# Patient Record
Sex: Female | Born: 1970 | Race: Black or African American | Hispanic: No | Marital: Single | Smoking: Former smoker
Health system: Southern US, Community
[De-identification: ages and names within clinical notes are randomized; demographics above are authoritative.]

## PROBLEM LIST (undated history)

## (undated) ENCOUNTER — Emergency Department: Discharge status: Absent without leave | Payer: Self-pay

## (undated) DIAGNOSIS — J45909 Unspecified asthma, uncomplicated: Secondary | ICD-10-CM

## (undated) DIAGNOSIS — M1990 Unspecified osteoarthritis, unspecified site: Secondary | ICD-10-CM

## (undated) DIAGNOSIS — D649 Anemia, unspecified: Secondary | ICD-10-CM

## (undated) HISTORY — PX: KNEE ARTHROSCOPY: SUR90

## (undated) HISTORY — DX: Anemia, unspecified: D64.9

---

## 2003-12-24 ENCOUNTER — Ambulatory Visit (HOSPITAL_COMMUNITY): Admission: RE | Admit: 2003-12-24 | Discharge: 2003-12-24 | Payer: Self-pay | Admitting: Gynecology

## 2005-07-20 ENCOUNTER — Emergency Department: Payer: Self-pay | Admitting: Emergency Medicine

## 2006-08-12 ENCOUNTER — Observation Stay: Payer: Self-pay | Admitting: Internal Medicine

## 2011-02-12 ENCOUNTER — Observation Stay: Payer: Self-pay | Admitting: Obstetrics and Gynecology

## 2012-01-29 ENCOUNTER — Ambulatory Visit: Payer: Self-pay

## 2014-08-15 ENCOUNTER — Emergency Department
Admission: EM | Admit: 2014-08-15 | Discharge: 2014-08-15 | Disposition: A | Discharge status: Home / Self Care | Payer: Self-pay | Attending: Emergency Medicine | Admitting: Emergency Medicine

## 2014-08-15 DIAGNOSIS — K029 Dental caries, unspecified: Secondary | ICD-10-CM | POA: Insufficient documentation

## 2014-08-15 DIAGNOSIS — K047 Periapical abscess without sinus: Secondary | ICD-10-CM | POA: Insufficient documentation

## 2014-08-15 MED ORDER — HYDROCODONE-ACETAMINOPHEN 5-325 MG PO TABS: 1.0000 | ORAL_TABLET | ORAL | Status: DC | PRN

## 2014-08-15 MED ORDER — CLINDAMYCIN HCL 300 MG PO CAPS: 300.0000 mg | ORAL_CAPSULE | Freq: Three times a day (TID) | ORAL | Status: DC

## 2014-08-15 MED ORDER — IBUPROFEN 800 MG PO TABS: 800.0000 mg | ORAL_TABLET | Freq: Three times a day (TID) | ORAL | Status: DC | PRN

## 2014-08-15 NOTE — ED Provider Notes (Signed)
[facility] Emergency Department Provider Note  ____________________________________________  Time seen: Approximately 7:20 AM  I have reviewed the triage vital signs and the nursing notes.   HISTORY  Chief Complaint Dental Pain    HPI Lizeth Bencosme is a 44 y.o. female presents with 11 day history of dental pain. Just recently finished a course of antibiotics of amoxicillin. Still complains of dental swelling noted around the left lower jaw. Denies any trauma states hurts to eat. Rates pain as an 8/10 unable to sleep last night. Has a dental appointment this week.  No past medical history on file.  There are no active problems to display for this patient.   No past surgical history on file.  Current Outpatient Rx  Name  Route  Sig  Dispense  Refill  . clindamycin (CLEOCIN) 300 MG capsule   Oral   Take 1 capsule (300 mg total) by mouth 3 (three) times daily.   30 capsule   0   . HYDROcodone-acetaminophen (NORCO) 5-325 MG per tablet   Oral   Take 1 tablet by mouth every 4 (four) hours as needed for moderate pain.   12 tablet   0   . ibuprofen (ADVIL,MOTRIN) 800 MG tablet   Oral   Take 1 tablet (800 mg total) by mouth every 8 (eight) hours as needed.   30 tablet   0     Allergies Review of patient's allergies indicates no known allergies.  No family history on file.  Social History History  Substance Use Topics  . Smoking status: Not on file  . Smokeless tobacco: Not on file  . Alcohol Use: Not on file    Review of Systems Constitutional: No fever/chills elevated blood pressure noted. Eyes: No visual changes. ENT: No sore throat. Cardiovascular: Denies chest pain. Respiratory: Denies shortness of breath. Gastrointestinal: No abdominal pain.  No nausea, no vomiting.  No diarrhea.  No constipation. Genitourinary: Negative for dysuria. Musculoskeletal: Negative for back pain. Skin: Negative for rash. Neurological: Negative  for headaches, focal weakness or numbness. \ 10-point ROS otherwise negative.  ____________________________________________   PHYSICAL EXAM:  VITAL SIGNS: ED Triage Vitals  Enc Vitals Group     BP 08/15/14 0525 150/100 mmHg     Pulse Rate 08/15/14 0525 81     Resp 08/15/14 0525 20     Temp 08/15/14 0525 98 F (36.7 C)     Temp Source 08/15/14 0525 Oral     SpO2 08/15/14 0525 100 %     Weight 08/15/14 0526 180 lb (81.647 kg)     Height 08/15/14 0526 5\' 3"  (1.6 m)     Head Cir --      Peak Flow --      Pain Score 08/15/14 0526 8     Pain Loc --      Pain Edu? --      Excl. in [place]? --    \ Constitutional: Alert and oriented. Well appearing and in no acute distress. Head: Atraumatic. Nose: No congestion/rhinnorhea. Mouth/Throat: Mucous membranes are moist.  Oropharynx non-erythematous. Obvious dental caries noted left lower side. Tenderness to the jaw Neck: No stridor.   Cardiovascular: Normal rate, regular rhythm. Grossly normal heart sounds.  Good peripheral circulation. Respiratory: Normal respiratory effort.  No retractions. Lungs CTAB. Neurologic:  Normal speech and language. No gross focal neurologic deficits are appreciated. Speech is normal. No gait instability. Skin:  Skin is warm, dry and intact. No rash noted. Psychiatric: Mood and affect  are normal. Speech and behavior are normal.  ____________________________________________   LABS (all labs ordered are listed, but only abnormal results are displayed)  Labs Reviewed - No data to display ____________________________________________  EKG  None ____________________________________________  RADIOLOGY  None ____________________________________________   PROCEDURES  Procedure(s) performed: None  Critical Care performed: No  ____________________________________________   INITIAL IMPRESSION / ASSESSMENT AND PLAN / ED COURSE  Pertinent labs & imaging results that were available during my care of  the patient were reviewed by me and considered in my medical decision making (see chart for details).  Dental abscess. We'll treat prophylactically with clindamycin 300 mg 4 times a day, ibuprofen 800 mg 3 times a day, and hydrocodone. Patient has appointment this week with dentist follow-up. However an up-dated list was provided. No other EMC at this visit. Patient understands to return to the ER if worsening symptomatology. In addition, patient was instructed to monitor her blood pressure and follow up when her pain is decreased. ____________________________________________   FINAL CLINICAL IMPRESSION(S) / ED DIAGNOSES  Final diagnoses:  Dental abscess      Arlyss Repress, PA-C 08/15/14 1950  Lavonia Drafts, MD 08/15/14 [phone]

## 2014-08-15 NOTE — ED Notes (Signed)
Pt presents to ER stating right lower tooth pain. Pt states she was seen at [facility] several days ago and given narcotics and antibiotics. Pt states worsened pain tonight.

## 2014-08-15 NOTE — Discharge Instructions (Signed)
Dental Abscess A dental abscess is a collection of infected fluid (pus) from a bacterial infection in the inner part of the tooth (pulp). It usually occurs at the end of the tooth's root.  CAUSES   Severe tooth decay.  Trauma to the tooth that allows bacteria to enter into the pulp, such as a broken or chipped tooth. SYMPTOMS   Severe pain in and around the infected tooth.  Swelling and redness around the abscessed tooth or in the mouth or face.  Tenderness.  Pus drainage.  Bad breath.  Bitter taste in the mouth.  Difficulty swallowing.  Difficulty opening the mouth.  Nausea.  Vomiting.  Chills.  Swollen neck glands. DIAGNOSIS   A medical and dental history will be taken.  An examination will be performed by tapping on the abscessed tooth.  X-rays may be taken of the tooth to identify the abscess. TREATMENT The goal of treatment is to eliminate the infection. You may be prescribed antibiotic medicine to stop the infection from spreading. A root canal may be performed to save the tooth. If the tooth cannot be saved, it may be pulled (extracted) and the abscess may be drained.  HOME CARE INSTRUCTIONS  Only take over-the-counter or prescription medicines for pain, fever, or discomfort as directed by your caregiver.  Rinse your mouth (gargle) often with salt water ( tsp salt in 8 oz [250 ml] of warm water) to relieve pain or swelling.  Do not drive after taking pain medicine (narcotics).  Do not apply heat to the outside of your face.  Return to your dentist for further treatment as directed. SEEK MEDICAL CARE IF:  Your pain is not helped by medicine.  Your pain is getting worse instead of better. SEEK IMMEDIATE MEDICAL CARE IF:  You have a fever or persistent symptoms for more than 2-3 days.  You have a fever and your symptoms suddenly get worse.  You have chills or a very bad headache.  You have problems breathing or swallowing.  You have trouble  opening your mouth.  You have swelling in the neck or around the eye. Document Released: 03/12/2005 Document Revised: 12/05/2011 Document Reviewed: 06/20/2010 [facility] Patient Information 2015 [place], [place]. This information is not intended to replace advice given to you by your health care provider. Make sure you discuss any questions you have with your health care provider.   OPTIONS FOR DENTAL FOLLOW UP CARE  [place] [facility] and [place] [email or web address] [phone])  [facility] [phone])  [place] [phone])  [place] [phone])  [facility] [phone]) This clinic caters to the indigent population and is on a lottery system. Location: [employer] [facility], [place], [place], [place] Clinic Hours: Wednesdays from 6pm - 9pm, patients seen by a lottery system. For dates, call or go to [email or web address] Services: Cleanings, fillings and simple extractions. Payment Options: DENTAL WORK IS FREE OF CHARGE. Bring proof of income or support. Best way to get seen: Arrive at 5:15 pm - this is a lottery, NOT first come/first serve, so arriving earlier will not increase your chances of being seen.     [facility] [phone] Select option 1 for emergencies   Location: [facility], [place], [address], [place] Clinic Hours: No walk-ins accepted - call the day before to schedule an appointment. Check in times  are 9:30 am and 1:30 pm. Services: Simple extractions, temporary fillings, pulpectomy/pulp debridement, uncomplicated abscess drainage. Payment Options: PAYMENT IS DUE AT THE TIME OF SERVICE.  Fee is usually $100-200, additional surgical procedures (e.g. abscess drainage) may  be extra. Cash, checks, Visa/MasterCard accepted.  Can file Medicaid if patient is covered for dental - patient should call case worker to check. No discount for [facility] patients. Best way to get seen: MUST call the day before and get onto the schedule. Can usually be seen the next 1-2 days. No walk-ins accepted.     [place] [phone]   Location: [place], [place] Clinic Hours: M, W, Th, F 8am or 1:30pm, Tues 9a or 1:30 - first come/first served. Services: Simple extractions, temporary fillings, uncomplicated abscess drainage.  You do not need to be an [facility] resident. Payment Options: PAYMENT IS DUE AT THE TIME OF SERVICE. Dental insurance, otherwise sliding scale - bring proof of income or support. Depending on income and treatment needed, cost is usually $50-200. Best way to get seen: Arrive early as it is first come/first served.     [facility] [phone]   Location: [place] [facility] Hours: Mon-Thu 8a-5p Services: Most basic dental services including extractions and fillings. Payment Options: PAYMENT IS DUE AT THE TIME OF SERVICE. Sliding scale, up to 50% off - bring proof if income or support. Medicaid with dental option accepted. Best way to get seen: Call to schedule an appointment, can usually be seen within 2 weeks OR they will try to see walk-ins - show up at [place] or 2p (you may have to wait).     [facility] [place] RESIDENTS ONLY   Location: [facility], [place] [address] [place], [place] [address] Clinic Hours: By appointment only. Monday - Thursday 8am-5pm, Friday 8am-12pm Services: Cleanings, fillings, extractions. Payment Options: PAYMENT IS DUE AT THE TIME OF SERVICE. Cash, Visa or MasterCard. Sliding scale - $30 minimum per service. Best way to get seen: Come in to  office, complete packet and make an appointment - need proof of income or support monies for each household member and proof of [facility] residence. Usually takes about a month to get in.     [facility] [phone]   Location: [address] [place] Clinic Hours: Walk-in Urgent Care Dental Services are offered Monday-Friday mornings only. The numbers of emergencies accepted daily is limited to the number of providers available. Maximum 15 - Mondays, Wednesdays & Thursdays Maximum 10 - Tuesdays & Fridays Services: You do not need to be a [facility] resident to be seen for a dental emergency. Emergencies are defined as pain, swelling, abnormal bleeding, or dental trauma. Walkins will receive x-rays if needed. NOTE: Dental cleaning is not an emergency. Payment Options: PAYMENT IS DUE AT THE TIME OF SERVICE. Minimum co-pay is $40.00 for uninsured patients. Minimum co-pay is $3.00 for Medicaid with dental coverage. Dental Insurance is accepted and must be presented at time of visit. Medicare does not cover dental. Forms of payment: Cash, credit card, checks. Best way to get seen: If not previously registered with the clinic, walk-in dental registration begins at 7:15 am and is on a first come/first serve basis. If previously registered with the clinic, call to make an appointment.     [facility] [place] ONLY   Location: [address]  [place], [place] Clinic Hours: Mon-Thu 10a-2p Services: Extractions only! Payment Options: FREE (donations accepted) - bring proof of income or support Best way to get seen: Call and schedule an appointment OR come at 8am on the 1st Monday of every month (except for holidays) when it is first come/first served.     [facility] [phone]   Location: [place], [place] Clinic Hours: Friday mornings Services, Payment Options, Best way to get  seen: Call for info

## 2014-08-15 NOTE — ED Notes (Signed)
Pt states she was seen at [facility] for toothache. She was prescribed amoxicillin, last night facial swelling started, pt states the swelling in her face is getting bigger and bigger. Pain 8/10. Swelling noted to R side of face.

## 2014-08-15 NOTE — ED Notes (Signed)
NAD noted at time of D/C. Pt denies questions or concerns. Pt ambulatory to the lobby at this time.  

## 2015-10-13 ENCOUNTER — Emergency Department: Payer: Self-pay

## 2015-10-13 ENCOUNTER — Emergency Department
Admission: EM | Admit: 2015-10-13 | Discharge: 2015-10-13 | Disposition: A | Discharge status: Home / Self Care | Payer: Self-pay | Attending: Emergency Medicine | Admitting: Emergency Medicine

## 2015-10-13 ENCOUNTER — Encounter: Payer: Self-pay | Admitting: Emergency Medicine

## 2015-10-13 DIAGNOSIS — J45909 Unspecified asthma, uncomplicated: Secondary | ICD-10-CM | POA: Insufficient documentation

## 2015-10-13 DIAGNOSIS — M1990 Unspecified osteoarthritis, unspecified site: Secondary | ICD-10-CM | POA: Insufficient documentation

## 2015-10-13 DIAGNOSIS — J209 Acute bronchitis, unspecified: Secondary | ICD-10-CM

## 2015-10-13 DIAGNOSIS — Z87891 Personal history of nicotine dependence: Secondary | ICD-10-CM | POA: Insufficient documentation

## 2015-10-13 HISTORY — DX: Unspecified osteoarthritis, unspecified site: M19.90

## 2015-10-13 MED ORDER — ALBUTEROL SULFATE HFA 108 (90 BASE) MCG/ACT IN AERS: 2.0000 | INHALATION_SPRAY | Freq: Four times a day (QID) | RESPIRATORY_TRACT | Status: DC | PRN

## 2015-10-13 MED ORDER — AZITHROMYCIN 250 MG PO TABS: ORAL_TABLET | ORAL | Status: DC

## 2015-10-13 MED ORDER — IPRATROPIUM-ALBUTEROL 0.5-2.5 (3) MG/3ML IN SOLN
3.0000 mL | Freq: Once | RESPIRATORY_TRACT | Status: AC
  Administered 2015-10-13: 3 mL via RESPIRATORY_TRACT
  Filled 2015-10-13: qty 3

## 2015-10-13 MED ORDER — GUAIFENESIN-CODEINE 100-10 MG/5ML PO SOLN: 10.0000 mL | Freq: Three times a day (TID) | ORAL | Status: DC | PRN

## 2015-10-13 MED ORDER — PREDNISONE 10 MG PO TABS: 50.0000 mg | ORAL_TABLET | Freq: Every day | ORAL | Status: DC

## 2015-10-13 NOTE — Discharge Instructions (Signed)

## 2015-10-13 NOTE — ED Notes (Addendum)
Chest congestion and cough for about 1 month  States min relief with OTC meds  And cough is occasionally prod

## 2015-10-13 NOTE — ED Provider Notes (Signed)
[facility] Emergency Department Provider Note  ____________________________________________  Time seen: Approximately 8:14 AM  I have reviewed the triage vital signs and the nursing notes.   HISTORY  Chief Complaint No chief complaint on file.   HPI Tiffany Rodriguez is a 45 y.o. female who presents to the emergency department for evaluation of wheezing, cough, and chest congestion for approximately one month. She's had no relief with Mucinex. She is an asthmatic who is been out of her inhaler for quite some time. She states that she now has pain in her right mid and upper back with deep breath and cough. She denies fever. She does report some mild shortness of breath with exertion.   Past Medical History  Diagnosis Date  . Arthritis     There are no active problems to display for this patient.   History reviewed. No pertinent past surgical history.  Current Outpatient Rx  Name  Route  Sig  Dispense  Refill  . albuterol (PROVENTIL HFA;VENTOLIN HFA) 108 (90 Base) MCG/ACT inhaler   Inhalation   Inhale 2 puffs into the lungs every 6 (six) hours as needed for wheezing or shortness of breath.   1 Inhaler   2   . azithromycin (ZITHROMAX) 250 MG tablet      2 tablets today, then 1 tablet for the next 4 days.   6 each   0   . clindamycin (CLEOCIN) 300 MG capsule   Oral   Take 1 capsule (300 mg total) by mouth 3 (three) times daily.   30 capsule   0   . guaiFENesin-codeine 100-10 MG/5ML syrup   Oral   Take 10 mLs by mouth 3 (three) times daily as needed.   120 mL   0   . HYDROcodone-acetaminophen (NORCO) 5-325 MG per tablet   Oral   Take 1 tablet by mouth every 4 (four) hours as needed for moderate pain.   12 tablet   0   . ibuprofen (ADVIL,MOTRIN) 800 MG tablet   Oral   Take 1 tablet (800 mg total) by mouth every 8 (eight) hours as needed.   30 tablet   0   . predniSONE (DELTASONE) 10 MG tablet   Oral   Take 5 tablets (50 mg total)  by mouth daily.   25 tablet   0     Allergies Review of patient's allergies indicates no known allergies.  No family history on file.  Social History Social History  Substance Use Topics  . Smoking status: Former Research scientist (life sciences)  . Smokeless tobacco: None  . Alcohol Use: No    Review of Systems Constitutional: Negative for fever/chills ENT: Positive for sore throat. Cardiovascular: Denies chest pain. Respiratory: Positive for shortness of breath. Positive for cough. Gastrointestinal: Negative for nausea,  no vomiting.  No diarrhea.  Musculoskeletal: Negative for body aches Skin: Negative for rash. Neurological: Negative for headaches ____________________________________________   PHYSICAL EXAM:  VITAL SIGNS: ED Triage Vitals  Enc Vitals Group     BP 10/13/15 0757 148/86 mmHg     Pulse Rate 10/13/15 0757 81     Resp 10/13/15 0757 18     Temp 10/13/15 0757 99.3 F (37.4 C)     Temp Source 10/13/15 0757 Oral     SpO2 10/13/15 0757 100 %     Weight 10/13/15 0757 179 lb (81.194 kg)     Height 10/13/15 0757 5\' 3"  (1.6 m)     Head Cir --  Peak Flow --      Pain Score 10/13/15 0759 4     Pain Loc --      Pain Edu? --      Excl. in [place]? --     Constitutional: Alert and oriented. Acutely ill appearing and in no acute distress. Eyes: Conjunctivae are normal. EOMI. Ears: Exam deferred Nose: No congestion; no rhinnorhea. Mouth/Throat: Mucous membranes are moist.  Oropharynx normal. Tonsils appear 1+ bilaterally without exudate. Neck: No stridor.  Lymphatic: No cervical lymphadenopathy. Cardiovascular: Normal rate, regular rhythm. Grossly normal heart sounds.  Good peripheral circulation. Respiratory: Normal respiratory effort.  No retractions. Diminished breath sounds throughout. Gastrointestinal: Soft and nontender.  Musculoskeletal: FROM x 4 extremities.  Neurologic:  Normal speech and language.  Skin:  Skin is warm, dry and intact. No rash noted. Psychiatric: Mood  and affect are normal. Speech and behavior are normal.  ____________________________________________   LABS (all labs ordered are listed, but only abnormal results are displayed)  Labs Reviewed - No data to display ____________________________________________  EKG   ____________________________________________  RADIOLOGY  Course interstitial markings reflecting reactive airway disease. There is no obvious infiltrate per radiology. I, Sherrie George, personally viewed and evaluated these images (plain radiographs) as part of my medical decision making, as well as reviewing the written report by the radiologist.   ____________________________________________   PROCEDURES  Procedure(s) performed: None  Critical Care performed: No  ____________________________________________   INITIAL IMPRESSION / ASSESSMENT AND PLAN / ED COURSE  Pertinent labs & imaging results that were available during my care of the patient were reviewed by me and considered in my medical decision making (see chart for details).   DuoNeb treatment given in the emergency department. Patient reported feeling much better afterward. She will be given prescriptions for azithromycin, albuterol, prednisone, and Robitussin-AC. She was also given information about ALAMAP since she does not have any prescription insurance. She was encouraged to establish a primary care provider as soon as possible to follow-up with her asthma and develop a treatment plan. ____________________________________________   FINAL CLINICAL IMPRESSION(S) / ED DIAGNOSES  Final diagnoses:  Bronchitis with asthma, subacute       Victorino Dike, FNP 10/13/15 [place], MD 10/13/15 1157

## 2015-10-21 ENCOUNTER — Ambulatory Visit: Payer: Self-pay

## 2015-10-28 ENCOUNTER — Ambulatory Visit: Payer: Self-pay

## 2017-05-13 ENCOUNTER — Emergency Department
Admission: EM | Admit: 2017-05-13 | Discharge: 2017-05-13 | Disposition: A | Discharge status: Home / Self Care | Payer: Self-pay | Attending: Emergency Medicine | Admitting: Emergency Medicine

## 2017-05-13 ENCOUNTER — Other Ambulatory Visit: Payer: Self-pay

## 2017-05-13 DIAGNOSIS — Z79899 Other long term (current) drug therapy: Secondary | ICD-10-CM | POA: Insufficient documentation

## 2017-05-13 DIAGNOSIS — Z760 Encounter for issue of repeat prescription: Secondary | ICD-10-CM | POA: Insufficient documentation

## 2017-05-13 DIAGNOSIS — J4520 Mild intermittent asthma, uncomplicated: Secondary | ICD-10-CM | POA: Insufficient documentation

## 2017-05-13 DIAGNOSIS — Z87891 Personal history of nicotine dependence: Secondary | ICD-10-CM | POA: Insufficient documentation

## 2017-05-13 HISTORY — DX: Unspecified asthma, uncomplicated: J45.909

## 2017-05-13 MED ORDER — ALBUTEROL SULFATE HFA 108 (90 BASE) MCG/ACT IN AERS: 2.0000 | INHALATION_SPRAY | Freq: Four times a day (QID) | RESPIRATORY_TRACT | 2 refills | Status: DC | PRN

## 2017-05-13 MED ORDER — IPRATROPIUM-ALBUTEROL 0.5-2.5 (3) MG/3ML IN SOLN
3.0000 mL | Freq: Once | RESPIRATORY_TRACT | Status: AC
Start: 2017-05-13 — End: 2017-05-13
  Administered 2017-05-13: 3 mL via RESPIRATORY_TRACT
  Filled 2017-05-13: qty 3

## 2017-05-13 NOTE — ED Notes (Signed)
See triage note  States she developed URI sx;s several days ago  Developed some wheezing yesterday  Ran out of inhaler

## 2017-05-13 NOTE — ED Provider Notes (Signed)
[facility] Emergency Department Provider Note   ____________________________________________   First MD Initiated Contact with Patient 05/13/17 1557     (approximate)  I have reviewed the triage vital signs and the nursing notes.   HISTORY  Chief Complaint Asthma    HPI Tiffany Rodriguez is a 47 y.o. female patient complaining of "flareup" sporadic condition secondary to an upper respiratory infection.  Patient also requesting a refill on her inhaler.  Patient denies fever/chills associated complaint.  Patient denies nausea, vomiting, diarrhea.  Patient state URI signs and symptoms started approximately 3-4 days ago.  No pulses measured for complaint.  Patient appears in no acute distress.  Patient is talking in full sentences.   Past Medical History:  Diagnosis Date  . Arthritis   . Asthma     There are no active problems to display for this patient.   Past Surgical History:  Procedure Laterality Date  . KNEE ARTHROSCOPY      Prior to Admission medications   Medication Sig Start Date End Date Taking? Authorizing Provider  albuterol (PROVENTIL HFA;VENTOLIN HFA) 108 (90 Base) MCG/ACT inhaler Inhale 2 puffs into the lungs every 6 (six) hours as needed for wheezing or shortness of breath. 05/13/17   Sable Feil, PA-C  azithromycin (ZITHROMAX) 250 MG tablet 2 tablets today, then 1 tablet for the next 4 days. 10/13/15   Triplett, Johnette Abraham B, FNP  clindamycin (CLEOCIN) 300 MG capsule Take 1 capsule (300 mg total) by mouth 3 (three) times daily. 08/15/14   Beers, Pierce Crane, PA-C  guaiFENesin-codeine 100-10 MG/5ML syrup Take 10 mLs by mouth 3 (three) times daily as needed. 10/13/15   Triplett, Johnette Abraham B, FNP  HYDROcodone-acetaminophen (NORCO) 5-325 MG per tablet Take 1 tablet by mouth every 4 (four) hours as needed for moderate pain. 08/15/14   Beers, Pierce Crane, PA-C  ibuprofen (ADVIL,MOTRIN) 800 MG tablet Take 1 tablet (800 mg total) by mouth every 8 (eight)  hours as needed. 08/15/14   Beers, Pierce Crane, PA-C  predniSONE (DELTASONE) 10 MG tablet Take 5 tablets (50 mg total) by mouth daily. 10/13/15   Victorino Dike, FNP    Allergies Patient has no known allergies.  No family history on file.  Social History Social History   Tobacco Use  . Smoking status: Former Research scientist (life sciences)  . Smokeless tobacco: Never Used  Substance Use Topics  . Alcohol use: No  . Drug use: Not on file    Review of Systems Constitutional: No fever/chills Eyes: No visual changes. ENT: No sore throat. Cardiovascular: Denies chest pain. Respiratory: Denies shortness of breath.  Wheezing. Gastrointestinal: No abdominal pain.  No nausea, no vomiting.  No diarrhea.  No constipation. Genitourinary: Negative for dysuria. Musculoskeletal: Negative for back pain. Skin: Negative for rash. Neurological: Negative for headaches, focal weakness or numbness.   ____________________________________________   PHYSICAL EXAM:  VITAL SIGNS: ED Triage Vitals [05/13/17 1534]  Enc Vitals Group     BP (!) 157/90     Pulse Rate 87     Resp 18     Temp 99.4 F (37.4 C)     Temp Source Oral     SpO2 100 %     Weight 180 lb (81.6 kg)     Height 5\' 3"  (1.6 m)     Head Circumference      Peak Flow      Pain Score      Pain Loc      Pain Edu?  Excl. in [place]?    Constitutional: Alert and oriented. Well appearing and in no acute distress. Nose: No congestion/rhinnorhea. Mouth/Throat: Mucous membranes are moist.  Oropharynx non-erythematous. Neck: No stridor.  Hematological/Lymphatic/Immunilogical: No cervical lymphadenopathy. Cardiovascular: Normal rate, regular rhythm. Grossly normal heart sounds.  Good peripheral circulation. Respiratory: Normal respiratory effort.  No retractions. Lungs with inspiratory wheezing. Musculoskeletal: No lower extremity tenderness nor edema.  No joint effusions. Neurologic:  Normal speech and language. No gross focal neurologic deficits are  appreciated. No gait instability. Skin:  Skin is warm, dry and intact. No rash noted. Psychiatric: Mood and affect are normal. Speech and behavior are normal.  ____________________________________________   LABS (all labs ordered are listed, but only abnormal results are displayed)  Labs Reviewed - No data to display ____________________________________________  EKG   ____________________________________________  RADIOLOGY  ED MD interpretation:    Official radiology report(s): No results found.  ____________________________________________   PROCEDURES  Procedure(s) performed: None  Procedures  Critical Care performed: No  ____________________________________________   INITIAL IMPRESSION / ASSESSMENT AND PLAN / ED COURSE  As part of my medical decision making, I reviewed the following data within the [place]    Patient with ED stating her asthmatic condition has "flared up".  Patient O2 sats was at 100 however she does have mild wheezing.  Wheezing clear.  Status post 1 DuoNeb treatment.  Patient given discharge care instructions and [facility]  Patient given a prescription for albuterol inhaler.      ____________________________________________   FINAL CLINICAL IMPRESSION(S) / ED DIAGNOSES  Final diagnoses:  Mild intermittent asthma without complication  Medication refill     ED Discharge Orders        Ordered    albuterol (PROVENTIL HFA;VENTOLIN HFA) 108 (90 Base) MCG/ACT inhaler  Every 6 hours PRN     05/13/17 1619       Note:  This document was prepared using Dragon voice recognition software and may include unintentional dictation errors.    Sable Feil, PA-C 05/13/17 1623    Nena Polio, MD 05/13/17 2012

## 2017-05-13 NOTE — ED Triage Notes (Signed)
Pt states she has been sick with an URI, states in the past couple of days having a flare up with her asthma and her inhaler is expired.. Pt is in NAD on arrival, respirations WNL.

## 2018-12-17 ENCOUNTER — Other Ambulatory Visit: Payer: Self-pay

## 2018-12-17 ENCOUNTER — Observation Stay
Admission: EM | Admit: 2018-12-17 | Discharge: 2018-12-18 | Disposition: A | Discharge status: Home / Self Care | Payer: Self-pay | Attending: Obstetrics and Gynecology | Admitting: Obstetrics and Gynecology

## 2018-12-17 ENCOUNTER — Emergency Department: Payer: Self-pay

## 2018-12-17 DIAGNOSIS — Z20828 Contact with and (suspected) exposure to other viral communicable diseases: Secondary | ICD-10-CM | POA: Insufficient documentation

## 2018-12-17 DIAGNOSIS — N920 Excessive and frequent menstruation with regular cycle: Principal | ICD-10-CM | POA: Insufficient documentation

## 2018-12-17 DIAGNOSIS — N83202 Unspecified ovarian cyst, left side: Secondary | ICD-10-CM | POA: Insufficient documentation

## 2018-12-17 DIAGNOSIS — D62 Acute posthemorrhagic anemia: Secondary | ICD-10-CM | POA: Insufficient documentation

## 2018-12-17 DIAGNOSIS — N939 Abnormal uterine and vaginal bleeding, unspecified: Secondary | ICD-10-CM

## 2018-12-17 DIAGNOSIS — Z79899 Other long term (current) drug therapy: Secondary | ICD-10-CM | POA: Insufficient documentation

## 2018-12-17 DIAGNOSIS — D251 Intramural leiomyoma of uterus: Secondary | ICD-10-CM | POA: Insufficient documentation

## 2018-12-17 DIAGNOSIS — Z793 Long term (current) use of hormonal contraceptives: Secondary | ICD-10-CM | POA: Insufficient documentation

## 2018-12-17 DIAGNOSIS — F17210 Nicotine dependence, cigarettes, uncomplicated: Secondary | ICD-10-CM | POA: Insufficient documentation

## 2018-12-17 DIAGNOSIS — N921 Excessive and frequent menstruation with irregular cycle: Secondary | ICD-10-CM | POA: Diagnosis present

## 2018-12-17 DIAGNOSIS — D649 Anemia, unspecified: Secondary | ICD-10-CM

## 2018-12-17 DIAGNOSIS — N800 Endometriosis of uterus: Secondary | ICD-10-CM | POA: Insufficient documentation

## 2018-12-17 DIAGNOSIS — M1990 Unspecified osteoarthritis, unspecified site: Secondary | ICD-10-CM | POA: Insufficient documentation

## 2018-12-17 DIAGNOSIS — D219 Benign neoplasm of connective and other soft tissue, unspecified: Secondary | ICD-10-CM | POA: Diagnosis present

## 2018-12-17 DIAGNOSIS — N83201 Unspecified ovarian cyst, right side: Secondary | ICD-10-CM | POA: Insufficient documentation

## 2018-12-17 DIAGNOSIS — J45909 Unspecified asthma, uncomplicated: Secondary | ICD-10-CM | POA: Insufficient documentation

## 2018-12-17 DIAGNOSIS — Z7952 Long term (current) use of systemic steroids: Secondary | ICD-10-CM | POA: Insufficient documentation

## 2018-12-17 DIAGNOSIS — D259 Leiomyoma of uterus, unspecified: Secondary | ICD-10-CM | POA: Diagnosis present

## 2018-12-17 LAB — CBC
HCT: 18.7 % — ABNORMAL LOW (ref 36.0–46.0)
Hemoglobin: 5.2 g/dL — ABNORMAL LOW (ref 12.0–15.0)
MCH: 18.5 pg — ABNORMAL LOW (ref 26.0–34.0)
MCHC: 27.8 g/dL — ABNORMAL LOW (ref 30.0–36.0)
MCV: 66.5 fL — ABNORMAL LOW (ref 80.0–100.0)
Platelets: 243 10*3/uL (ref 150–400)
RBC: 2.81 MIL/uL — ABNORMAL LOW (ref 3.87–5.11)
RDW: 18.9 % — ABNORMAL HIGH (ref 11.5–15.5)
WBC: 10.1 10*3/uL (ref 4.0–10.5)
nRBC: 0 % (ref 0.0–0.2)

## 2018-12-17 LAB — BASIC METABOLIC PANEL
Anion gap: 8 (ref 5–15)
BUN: 15 mg/dL (ref 6–20)
CO2: 23 mmol/L (ref 22–32)
Calcium: 8.5 mg/dL — ABNORMAL LOW (ref 8.9–10.3)
Chloride: 106 mmol/L (ref 98–111)
Creatinine, Ser: 0.79 mg/dL (ref 0.44–1.00)
GFR calc Af Amer: >60 mL/min (ref >60)
GFR calc non Af Amer: >60 mL/min (ref >60)
Glucose, Bld: 122 mg/dL — ABNORMAL HIGH (ref 70–99)
Potassium: 3.9 mmol/L (ref 3.5–5.1)
Sodium: 137 mmol/L (ref 135–145)

## 2018-12-17 LAB — WET PREP, GENITAL
Clue Cells Wet Prep HPF POC: NONE SEEN
Sperm: NONE SEEN
Trich, Wet Prep: NONE SEEN
Yeast Wet Prep HPF POC: NONE SEEN

## 2018-12-17 LAB — PROTIME-INR
INR: 1 (ref 0.8–1.2)
Prothrombin Time: 13.3 seconds (ref 11.4–15.2)

## 2018-12-17 LAB — HCG, QUANTITATIVE, PREGNANCY: hCG, Beta Chain, Quant, S: <1 m[IU]/mL (ref <5)

## 2018-12-17 LAB — APTT: aPTT: 30 seconds (ref 24–36)

## 2018-12-17 LAB — ABO/RH: ABO/RH(D): AB POS

## 2018-12-17 LAB — PREPARE RBC (CROSSMATCH)

## 2018-12-17 MED ORDER — MEDROXYPROGESTERONE ACETATE 10 MG PO TABS
20.0000 mg | ORAL_TABLET | Freq: Once | ORAL | Status: AC
  Administered 2018-12-17: 20 mg via ORAL
  Filled 2018-12-17: qty 2

## 2018-12-17 MED ORDER — SODIUM CHLORIDE 0.9 % IV SOLN: 10.0000 mL/h | Freq: Once | INTRAVENOUS | Status: DC

## 2018-12-17 NOTE — ED Provider Notes (Signed)
[facility] Emergency Department Provider Note  ____________________________________________   First MD Initiated Contact with Patient 12/17/18 1850     (approximate)  I have reviewed the triage vital signs and the nursing notes.  History  Chief Complaint Vaginal Bleeding    HPI Tiffany Rodriguez is a 48 y.o. female who presents the emergency department for heavy vaginal bleeding associated with lightheadedness, and shortness of breath on exertion.  Patient states many years ago (10-12 years) she had an episode of heavy vaginal bleeding, that required some kind of procedure with OB/GYN (perhaps a D&C), and possibly removal of a polyp or potential fibroid.  Since then, she has had moderately heavy, but manageable menses.  However, this LMP started on Sunday, and has been profoundly heavier than normal.  She reports passing multiple heavy clots daily.  She has associated lightheadedness and shortness of breath on exertion.  She is not on any blood thinning medications.  Patient states she normally uses 3-5 pads per day when on her menses, but today required a pad almost every hour.   Past Medical Hx Past Medical History:  Diagnosis Date  . Arthritis   . Asthma     Problem List There are no active problems to display for this patient.   Past Surgical Hx Past Surgical History:  Procedure Laterality Date  . KNEE ARTHROSCOPY      Medications Prior to Admission medications   Medication Sig Start Date End Date Taking? Authorizing Provider  albuterol (PROVENTIL HFA;VENTOLIN HFA) 108 (90 Base) MCG/ACT inhaler Inhale 2 puffs into the lungs every 6 (six) hours as needed for wheezing or shortness of breath. 05/13/17   Sable Feil, PA-C  azithromycin (ZITHROMAX) 250 MG tablet 2 tablets today, then 1 tablet for the next 4 days. 10/13/15   Triplett, Johnette Abraham B, FNP  clindamycin (CLEOCIN) 300 MG capsule Take 1 capsule (300 mg total) by mouth 3 (three) times daily.  08/15/14   Beers, Pierce Crane, PA-C  guaiFENesin-codeine 100-10 MG/5ML syrup Take 10 mLs by mouth 3 (three) times daily as needed. 10/13/15   Triplett, Johnette Abraham B, FNP  HYDROcodone-acetaminophen (NORCO) 5-325 MG per tablet Take 1 tablet by mouth every 4 (four) hours as needed for moderate pain. 08/15/14   Beers, Pierce Crane, PA-C  ibuprofen (ADVIL,MOTRIN) 800 MG tablet Take 1 tablet (800 mg total) by mouth every 8 (eight) hours as needed. 08/15/14   Beers, Pierce Crane, PA-C  predniSONE (DELTASONE) 10 MG tablet Take 5 tablets (50 mg total) by mouth daily. 10/13/15   Victorino Dike, FNP    Allergies Patient has no known allergies.  Family Hx No family history on file.  Social Hx Social History   Tobacco Use  . Smoking status: Former Research scientist (life sciences)  . Smokeless tobacco: Never Used  Substance Use Topics  . Alcohol use: No  . Drug use: Not on file     Review of Systems  Constitutional: Negative for fever, chills. Eyes: Negative for visual changes. ENT: Negative for sore throat. Cardiovascular: Negative for chest pain. Respiratory: Negative for shortness of breath. Gastrointestinal: Negative for nausea, vomiting.  Genitourinary: Negative for dysuria. + vaginal bleeding Musculoskeletal: Negative for leg swelling. Skin: Negative for rash. Neurological: Negative for for headaches.   Physical Exam  Vital Signs: ED Triage Vitals  Enc Vitals Group     BP 12/17/18 1803 (!) 149/78     Pulse Rate 12/17/18 1803 (!) 102     Resp 12/17/18 1803 18  Temp 12/17/18 1803 100 F (37.8 C)     Temp Source 12/17/18 1803 Oral     SpO2 12/17/18 1803 100 %     Weight 12/17/18 1804 190 lb (86.2 kg)     Height 12/17/18 1804 5\' 3"  (1.6 m)     Head Circumference --      Peak Flow --      Pain Score 12/17/18 1804 0     Pain Loc --      Pain Edu? --      Excl. in [place]? --     Constitutional: Alert and oriented.  Head: Normocephalic. Atraumatic. Eyes: Conjunctivae are extremely pale.  Nose: No congestion. No  rhinorrhea. Mouth/Throat: Mucous membranes are moist.  Neck: No stridor.   Cardiovascular: Mild tachycardia, HR low 100s, regular rhythm. Extremities well perfused. Respiratory: Normal respiratory effort.  Lungs CTAB. Gastrointestinal: Soft. Non-tender. Non-distended.  Pelvic: NT chaperone present. Patient has several large clots in the vaginal canal. With clearing she has continuous blood filling the vaginal canal. Unable to visualize cervix even after scopettes and guaze.  Musculoskeletal: No lower extremity edema. No deformities. Neurologic:  Normal speech and language. No gross focal neurologic deficits are appreciated.  Skin: Skin is warm, dry and intact. No rash noted. Psychiatric: Mood and affect are appropriate for situation.  EKG  N/A    Radiology  N/A   Procedures  Procedure(s) performed (including critical care):  .Critical Care Performed by: Lilia Pro., MD Authorized by: Lilia Pro., MD   Critical care provider statement:    Critical care time (minutes):  30   Critical care was time spent personally by me on the following activities:  Discussions with consultants, evaluation of patient's response to treatment, examination of patient, ordering and performing treatments and interventions, ordering and review of laboratory studies, ordering and review of radiographic studies, pulse oximetry, re-evaluation of patient's condition, obtaining history from patient or surrogate and review of old charts     Initial Impression / Assessment and Plan / ED Course  48 y.o. female who presents to the ED for heavy vaginal bleeding, as above.   Labs initiated in triage reveal significant anemia to 5.2. Discussed transfusion w/ patient who is in agreement.   On exam, she has several large clots in the vaginal canal. With clearing she has continuous blood filling the vaginal canal. Unable to visualize cervix even after scopettes and guaze.   Discussed case with OB/GYN.   Will obtain ultrasound and give dose of Provera to help with bleeding. Will touch base again after this.   Final Clinical Impression(s) / ED Diagnosis  Final diagnoses:  Vaginal bleeding  Low hemoglobin     Note:  This document was prepared using Dragon voice recognition software and may include unintentional dictation errors.   Lilia Pro., MD 12/17/18 [phone]

## 2018-12-17 NOTE — ED Triage Notes (Signed)
Pt reports normal vaginal bleeding X 3 days and a significant increase in amount of vaginal bleeding today. States it is time for menstrual period. Reports "regular menstrual cramps". Pt alert and oriented X4, cooperative, RR even and unlabored, color WNL. Pt in NAD.

## 2018-12-17 NOTE — ED Notes (Signed)
Signature pad failed. Hard copy of blood consent printed and signed by patient.

## 2018-12-17 NOTE — ED Notes (Signed)
Ultrasound at bedside

## 2018-12-18 ENCOUNTER — Encounter: Payer: Self-pay | Admitting: Obstetrics and Gynecology

## 2018-12-18 ENCOUNTER — Observation Stay: Payer: Self-pay

## 2018-12-18 DIAGNOSIS — D259 Leiomyoma of uterus, unspecified: Secondary | ICD-10-CM | POA: Diagnosis present

## 2018-12-18 DIAGNOSIS — D219 Benign neoplasm of connective and other soft tissue, unspecified: Secondary | ICD-10-CM | POA: Diagnosis present

## 2018-12-18 DIAGNOSIS — N83202 Unspecified ovarian cyst, left side: Secondary | ICD-10-CM | POA: Diagnosis present

## 2018-12-18 DIAGNOSIS — N921 Excessive and frequent menstruation with irregular cycle: Secondary | ICD-10-CM | POA: Diagnosis present

## 2018-12-18 DIAGNOSIS — D500 Iron deficiency anemia secondary to blood loss (chronic): Secondary | ICD-10-CM

## 2018-12-18 DIAGNOSIS — D62 Acute posthemorrhagic anemia: Secondary | ICD-10-CM | POA: Diagnosis present

## 2018-12-18 DIAGNOSIS — D649 Anemia, unspecified: Secondary | ICD-10-CM | POA: Diagnosis present

## 2018-12-18 DIAGNOSIS — N83201 Unspecified ovarian cyst, right side: Secondary | ICD-10-CM | POA: Diagnosis present

## 2018-12-18 LAB — CBC
HCT: 23.4 % — ABNORMAL LOW (ref 36.0–46.0)
Hemoglobin: 7.1 g/dL — ABNORMAL LOW (ref 12.0–15.0)
MCH: 21.8 pg — ABNORMAL LOW (ref 26.0–34.0)
MCHC: 30.3 g/dL (ref 30.0–36.0)
MCV: 71.8 fL — ABNORMAL LOW (ref 80.0–100.0)
Platelets: 177 10*3/uL (ref 150–400)
RBC: 3.26 MIL/uL — ABNORMAL LOW (ref 3.87–5.11)
RDW: 20.8 % — ABNORMAL HIGH (ref 11.5–15.5)
WBC: 6.4 10*3/uL (ref 4.0–10.5)
nRBC: 0 % (ref 0.0–0.2)

## 2018-12-18 LAB — SARS CORONAVIRUS 2 (TAT 6-24 HRS): SARS Coronavirus 2: NEGATIVE

## 2018-12-18 MED ORDER — MEDROXYPROGESTERONE ACETATE 10 MG PO TABS
20.0000 mg | ORAL_TABLET | Freq: Three times a day (TID) | ORAL | Status: DC
  Administered 2018-12-18 (×2): 20 mg via ORAL
  Filled 2018-12-18 (×2): qty 2

## 2018-12-18 MED ORDER — SODIUM CHLORIDE 0.9 % IV SOLN
200.0000 mg | Freq: Once | INTRAVENOUS | Status: AC
  Administered 2018-12-18: 13:00:00 200 mg via INTRAVENOUS
  Filled 2018-12-18: qty 10

## 2018-12-18 MED ORDER — SODIUM CHLORIDE 0.9 % IV SOLN: 1000.0000 mg | Freq: Once | INTRAVENOUS | Status: DC

## 2018-12-18 MED ORDER — LACTATED RINGERS IV SOLN
125.0000 mL/h | INTRAVENOUS | Status: DC
  Administered 2018-12-18: 03:00:00 125 mL/h via INTRAVENOUS

## 2018-12-18 MED ORDER — MEDROXYPROGESTERONE ACETATE 10 MG PO TABS: 20.0000 mg | ORAL_TABLET | Freq: Three times a day (TID) | ORAL | 3 refills | Status: DC

## 2018-12-18 MED ORDER — SODIUM CHLORIDE 0.9 % IV SOLN: 100.0000 mg | Freq: Once | INTRAVENOUS | Status: DC

## 2018-12-18 MED ORDER — SODIUM CHLORIDE 0.9 % IV SOLN
INTRAVENOUS | Status: DC | PRN
  Administered 2018-12-18: 13:00:00 250 mL via INTRAVENOUS

## 2018-12-18 MED ORDER — GADOBUTROL 1 MMOL/ML IV SOLN
8.0000 mL | Freq: Once | INTRAVENOUS | Status: AC | PRN
  Administered 2018-12-18: 11:00:00 8 mL via INTRAVENOUS

## 2018-12-18 NOTE — Progress Notes (Signed)
Tiffany Rodriguez is a 48 y.o. female patient.  1. Vaginal bleeding   2. Low hemoglobin    Patient reports that her vaginal bleeding is significantly improved. She has not passed clots. She is not changing her pad every hour.    Past Medical History:  Diagnosis Date  . Arthritis   . Asthma     Current Facility-Administered Medications  Medication Dose Route Frequency Provider Last Rate Last Dose  . iron sucrose (VENOFER) 200 mg in sodium chloride 0.9 % 150 mL IVPB  200 mg Intravenous Once Moroni Nester R, MD      . lactated ringers infusion  125 mL/hr Intravenous Continuous Will Bonnet, MD 125 mL/hr at 12/18/18 0800 125 mL/hr at 12/18/18 0800  . medroxyPROGESTERone (PROVERA) tablet 20 mg  20 mg Oral Q8H Will Bonnet, MD   20 mg at 12/18/18 [id number]   No Known Allergies Principal Problem:   Anemia associated with acute blood loss Active Problems:   Menorrhagia with irregular cycle   Fibroid uterus   Bilateral ovarian cysts   Severe anemia  Blood pressure 127/79, pulse 78, temperature 98.8 F (37.1 C), temperature source Oral, resp. rate 20, height 5\' 3"  (1.6 m), weight 86.2 kg, last menstrual period 12/14/2018, SpO2 100 %.  Review of Systems  Constitutional: Negative for chills, fever, malaise/fatigue and weight loss.  HENT: Negative for congestion, hearing loss and sinus pain.   Eyes: Negative for blurred vision and double vision.  Respiratory: Negative for cough, sputum production, shortness of breath and wheezing.   Cardiovascular: Negative for chest pain, palpitations, orthopnea and leg swelling.  Gastrointestinal: Negative for abdominal pain, constipation, diarrhea, nausea and vomiting.  Genitourinary: Negative for dysuria, flank pain, frequency, hematuria and urgency.  Musculoskeletal: Negative for back pain, falls and joint pain.  Skin: Negative for itching and rash.  Neurological: Negative for dizziness and headaches.  Psychiatric/Behavioral: Negative  for depression, substance abuse and suicidal ideas. The patient is not nervous/anxious.     Physical Exam Vitals signs and nursing note reviewed.  Constitutional:      Appearance: She is well-developed.  HENT:     Head: Normocephalic and atraumatic.  Eyes:     Pupils: Pupils are equal, round, and reactive to light.  Cardiovascular:     Rate and Rhythm: Normal rate and regular rhythm.  Pulmonary:     Effort: Pulmonary effort is normal. No respiratory distress.  Skin:    General: Skin is warm and dry.  Neurological:     Mental Status: She is alert and oriented to person, place, and time.  Psychiatric:        Behavior: Behavior normal.        Thought Content: Thought content normal.        Judgment: Judgment normal.    48 yo with abnormal uterine bleeding.  1. Continue Provera 20 mg TID 2. S/p 2uPRBC, complete and repeat CBC at 11 am.  3. Pelvic MRI to better visualize uterus and ovaries 4. Will obtain ROMA given abnormal ovarian appearance.  5. Discussed management and follow up in detail with patient. Will need endometrial biopsy, pap smear and consideration of hysterectomy outpatient.   Anticipate discharge home this evening.    Kjirsten Bloodgood R Keeon Zurn 12/18/2018

## 2018-12-18 NOTE — ED Provider Notes (Signed)
I assumed care of the patient from Dr. Joan Mayans at 11:00 PM.  Ultrasound revealed fibroid uterus with a dominant fibroid of 10.5 cm.  In addition complex adnexal cystic structures noted bilaterally measuring up to 11.5 cm on the right 6.4 in the left.  Patient discussed with Dr. Glennon Mac OB/GYN who will admit the patient for further evaluation and management.   Gregor Hams, MD 12/18/18 [phone]

## 2018-12-18 NOTE — Progress Notes (Signed)
Pt back to unit from MRI.

## 2018-12-18 NOTE — ED Notes (Signed)
.. ED TO INPATIENT HANDOFF REPORT  ED Nurse Name and Phone #: Marisue Ivan  S Name/Age/Gender Cory Roughen 48 y.o. female Room/Bed: ED26A/ED26A  Code Status   Code Status: Not on file  Home/SNF/Other Home Patient oriented to: self, place, time and situation Is this baseline? Yes   Triage Complete: Triage complete  Chief Complaint Vaginal bleeding  Triage Note Pt reports normal vaginal bleeding X 3 days and a significant increase in amount of vaginal bleeding today. States it is time for menstrual period. Reports "regular menstrual cramps". Pt alert and oriented X4, cooperative, RR even and unlabored, color WNL. Pt in NAD.    Allergies No Known Allergies  Level of Care/Admitting Diagnosis ED Disposition    ED Disposition Condition Comment   Admit  The patient appears reasonably stabilized for admission considering the current resources, flow, and capabilities available in the ED at this time, and I doubt any other [facility] requiring further screening and/or treatment in the ED prior to admission is  present.       B Medical/Surgery History Past Medical History:  Diagnosis Date  . Arthritis   . Asthma    Past Surgical History:  Procedure Laterality Date  . KNEE ARTHROSCOPY       A IV Location/Drains/Wounds Patient Lines/Drains/Airways Status   Active Line/Drains/Airways    Name:   Placement date:   Placement time:   Site:   Days:   Peripheral IV 12/17/18 Right Antecubital   12/17/18    1933    Antecubital   1   Peripheral IV 12/17/18 Left Hand   12/17/18    1943    Hand   1          Intake/Output Last 24 hours  Intake/Output Summary (Last 24 hours) at 12/18/2018 0116 Last data filed at 12/18/2018 0030 Gross per 24 hour  Intake 1060 ml  Output -  Net 1060 ml    Labs/Imaging Results for orders placed or performed during the hospital encounter of 12/17/18 (from the past 48 hour(s))  CBC     Status: Abnormal   Collection Time: 12/17/18  6:05 PM   Result Value Ref Range   WBC 10.1 4.0 - 10.5 K/uL   RBC 2.81 (L) 3.87 - 5.11 MIL/uL   Hemoglobin 5.2 (L) 12.0 - 15.0 g/dL    Comment: Reticulocyte Hemoglobin testing may be clinically indicated, consider ordering this additional test [id number]    HCT 18.7 (L) 36.0 - 46.0 %   MCV 66.5 (L) 80.0 - 100.0 fL   MCH 18.5 (L) 26.0 - 34.0 pg   MCHC 27.8 (L) 30.0 - 36.0 g/dL   RDW 18.9 (H) 11.5 - 15.5 %   Platelets 243 150 - 400 K/uL   nRBC 0.0 0.0 - 0.2 %    Comment: Performed at [facility], [address] [place],  [id number]  Basic metabolic panel     Status: Abnormal   Collection Time: 12/17/18  6:05 PM  Result Value Ref Range   Sodium 137 135 - 145 mmol/L   Potassium 3.9 3.5 - 5.1 mmol/L   Chloride 106 98 - 111 mmol/L   CO2 23 22 - 32 mmol/L   Glucose, Bld 122 (H) 70 - 99 mg/dL   BUN 15 6 - 20 mg/dL   Creatinine, Ser 0.79 0.44 - 1.00 mg/dL   Calcium 8.5 (L) 8.9 - 10.3 mg/dL   GFR calc non Af Amer >60 >60 mL/min   GFR calc Af Amer >  60 >60 mL/min   Anion gap 8 5 - 15    Comment: Performed at [facility], [address] [place], [place] [address]  ABO/Rh     Status: None   Collection Time: 12/17/18  6:05 PM  Result Value Ref Range   ABO/RH(D)      AB POS Performed at [facility], [address] [place], [place] [address]   Protime-INR     Status: None   Collection Time: 12/17/18  6:49 PM  Result Value Ref Range   Prothrombin Time 13.3 11.4 - 15.2 seconds   INR 1.0 0.8 - 1.2    Comment: (NOTE) INR goal varies based on device and disease states. Performed at [facility], [address] [place], [place] [address]   Type and screen [place]     Status: None (Preliminary result)   Collection Time: 12/17/18  6:49 PM  Result Value Ref Range   ABO/RH(D) AB POS    Antibody Screen NEG    Sample Expiration 12/20/2018,2359    Unit Number [id number]    Blood Component Type RED CELLS,LR    Unit  division 00    Status of Unit ISSUED    Transfusion Status OK TO TRANSFUSE    Crossmatch Result Compatible    Unit Number [id number]    Blood Component Type RED CELLS,LR    Unit division 00    Status of Unit ISSUED    Transfusion Status OK TO TRANSFUSE    Crossmatch Result      Compatible Performed at [facility], [address] [place], [place] [address]   APTT     Status: None   Collection Time: 12/17/18  6:49 PM  Result Value Ref Range   aPTT 30 24 - 36 seconds    Comment: Performed at [facility], [address] [place], [place] [address]  Prepare RBC     Status: None   Collection Time: 12/17/18  7:34 PM  Result Value Ref Range   Order Confirmation      ORDER PROCESSED BY BLOOD BANK Performed at [facility], [address] [place], [place] [address]   hCG, quantitative, pregnancy     Status: None   Collection Time: 12/17/18  7:47 PM  Result Value Ref Range   hCG, Beta Chain, Quant, S <1 <5 mIU/mL    Comment:          GEST. AGE      CONC.  (mIU/mL)   <=1 WEEK        5 - 50     2 WEEKS       50 - 500     3 WEEKS       100 - 10,000     4 WEEKS     1,000 - 30,000     5 WEEKS     3,500 - 115,000   6-8 WEEKS     12,000 - 270,000    12 WEEKS     15,000 - 220,000        FEMALE AND NON-PREGNANT FEMALE:     LESS THAN 5 mIU/mL Performed at [facility], [address] [place], [place] [address]   Wet prep, genital     Status: Abnormal   Collection Time: 12/17/18  8:57 PM  Result Value Ref Range   Yeast Wet Prep HPF POC NONE SEEN NONE SEEN   Trich, Wet Prep NONE SEEN NONE SEEN  Clue Cells Wet Prep HPF POC NONE SEEN NONE SEEN   WBC, Wet Prep HPF POC RARE (A) NONE SEEN   Sperm NONE SEEN     Comment: Performed at [facility], [address] [place], [place] [address]   US Transvaginal Non-ob  Result Date: 12/18/2018 CLINICAL DATA:  Initial evaluation for acute vaginal bleeding. EXAM: TRANSABDOMINAL AND  TRANSVAGINAL ULTRASOUND OF PELVIS DOPPLER ULTRASOUND OF OVARIES TECHNIQUE: Both transabdominal and transvaginal ultrasound examinations of the pelvis were performed. Transabdominal technique was performed for global imaging of the pelvis including uterus, ovaries, adnexal regions, and pelvic cul-de-sac. It was necessary to proceed with endovaginal exam following the transabdominal exam to visualize the uterus, endometrium, and ovaries. Color and duplex Doppler ultrasound was utilized to evaluate blood flow to the ovaries. COMPARISON:  Prior ultrasound from 08/12/2006. FINDINGS: Uterus Measurements: 15.1 x 10.6 x 13.3 cm = volume: 1122.8 mL. Large probable fibroid within the central aspect of the uterus measures 9.6 x 6.9 x 10.5 cm, obscuring the underlying endometrium. Additional 1.5 x 1.5 x 1.4 cm intramural fibroid at the left uterine fundus. Endometrium Not visualized. Right ovary The native right ovary is not definitely seen. There is a large mildly complex cystic structure measuring 11.1 x 4.9 x 4.2 cm. Lesion is somewhat oblong an tubular morphology, with scattered low-level internal echoes. No appreciable vascularity. Left ovary The native left ovary is not definitely seen. There is a complex cystic structure within the left adnexa measuring 6.4 x 2.6 x 3.8 cm. Unclear whether this reflects a single complex cystic lesion multiple adjacent CIS, and/or possible hydrosalpinx. Evaluation for possible ovarian torsion limited on this examination. Pulsed Doppler interrogation of both adnexa demonstrates normal arterial and venous waveforms, with no definite evidence for torsion. Other findings No abnormal free fluid. IMPRESSION: 1. Enlarged fibroid uterus with dominant 10.5 cm fibroid within the central aspect of the uterus. 2. Nonvisualization of the endometrium, obscured by the overlying fibroid. 3. Complex cystic lesions within the bilateral adnexa, measuring up to 11.1 cm on the right and 6.4 cm on the left,  indeterminate. Gynecologic referral for further workup and consultation recommended. 4. The underlying native ovaries are not well seen on this exam due to the adnexal cystic lesions. No obvious evidence for torsion. Electronically Signed   By: Jeannine Boga M.D.   On: 12/18/2018 00:25   US Pelvis Complete  Result Date: 12/18/2018 CLINICAL DATA:  Initial evaluation for acute vaginal bleeding. EXAM: TRANSABDOMINAL AND TRANSVAGINAL ULTRASOUND OF PELVIS DOPPLER ULTRASOUND OF OVARIES TECHNIQUE: Both transabdominal and transvaginal ultrasound examinations of the pelvis were performed. Transabdominal technique was performed for global imaging of the pelvis including uterus, ovaries, adnexal regions, and pelvic cul-de-sac. It was necessary to proceed with endovaginal exam following the transabdominal exam to visualize the uterus, endometrium, and ovaries. Color and duplex Doppler ultrasound was utilized to evaluate blood flow to the ovaries. COMPARISON:  Prior ultrasound from 08/12/2006. FINDINGS: Uterus Measurements: 15.1 x 10.6 x 13.3 cm = volume: 1122.8 mL. Large probable fibroid within the central aspect of the uterus measures 9.6 x 6.9 x 10.5 cm, obscuring the underlying endometrium. Additional 1.5 x 1.5 x 1.4 cm intramural fibroid at the left uterine fundus. Endometrium Not visualized. Right ovary The native right ovary is not definitely seen. There is a large mildly complex cystic structure measuring 11.1 x 4.9 x 4.2 cm. Lesion is somewhat oblong an tubular morphology, with scattered low-level internal echoes. No appreciable vascularity. Left ovary The native left ovary is  not definitely seen. There is a complex cystic structure within the left adnexa measuring 6.4 x 2.6 x 3.8 cm. Unclear whether this reflects a single complex cystic lesion multiple adjacent CIS, and/or possible hydrosalpinx. Evaluation for possible ovarian torsion limited on this examination. Pulsed Doppler interrogation of both adnexa  demonstrates normal arterial and venous waveforms, with no definite evidence for torsion. Other findings No abnormal free fluid. IMPRESSION: 1. Enlarged fibroid uterus with dominant 10.5 cm fibroid within the central aspect of the uterus. 2. Nonvisualization of the endometrium, obscured by the overlying fibroid. 3. Complex cystic lesions within the bilateral adnexa, measuring up to 11.1 cm on the right and 6.4 cm on the left, indeterminate. Gynecologic referral for further workup and consultation recommended. 4. The underlying native ovaries are not well seen on this exam due to the adnexal cystic lesions. No obvious evidence for torsion. Electronically Signed   By: Jeannine Boga M.D.   On: 12/18/2018 00:25   [place] Art/ven Flow Abd Pelv Doppler  Result Date: 12/18/2018 CLINICAL DATA:  Initial evaluation for acute vaginal bleeding. EXAM: TRANSABDOMINAL AND TRANSVAGINAL ULTRASOUND OF PELVIS DOPPLER ULTRASOUND OF OVARIES TECHNIQUE: Both transabdominal and transvaginal ultrasound examinations of the pelvis were performed. Transabdominal technique was performed for global imaging of the pelvis including uterus, ovaries, adnexal regions, and pelvic cul-de-sac. It was necessary to proceed with endovaginal exam following the transabdominal exam to visualize the uterus, endometrium, and ovaries. Color and duplex Doppler ultrasound was utilized to evaluate blood flow to the ovaries. COMPARISON:  Prior ultrasound from 08/12/2006. FINDINGS: Uterus Measurements: 15.1 x 10.6 x 13.3 cm = volume: 1122.8 mL. Large probable fibroid within the central aspect of the uterus measures 9.6 x 6.9 x 10.5 cm, obscuring the underlying endometrium. Additional 1.5 x 1.5 x 1.4 cm intramural fibroid at the left uterine fundus. Endometrium Not visualized. Right ovary The native right ovary is not definitely seen. There is a large mildly complex cystic structure measuring 11.1 x 4.9 x 4.2 cm. Lesion is somewhat oblong an tubular morphology,  with scattered low-level internal echoes. No appreciable vascularity. Left ovary The native left ovary is not definitely seen. There is a complex cystic structure within the left adnexa measuring 6.4 x 2.6 x 3.8 cm. Unclear whether this reflects a single complex cystic lesion multiple adjacent CIS, and/or possible hydrosalpinx. Evaluation for possible ovarian torsion limited on this examination. Pulsed Doppler interrogation of both adnexa demonstrates normal arterial and venous waveforms, with no definite evidence for torsion. Other findings No abnormal free fluid. IMPRESSION: 1. Enlarged fibroid uterus with dominant 10.5 cm fibroid within the central aspect of the uterus. 2. Nonvisualization of the endometrium, obscured by the overlying fibroid. 3. Complex cystic lesions within the bilateral adnexa, measuring up to 11.1 cm on the right and 6.4 cm on the left, indeterminate. Gynecologic referral for further workup and consultation recommended. 4. The underlying native ovaries are not well seen on this exam due to the adnexal cystic lesions. No obvious evidence for torsion. Electronically Signed   By: Jeannine Boga M.D.   On: 12/18/2018 00:25    Pending Labs Unresulted Labs (From admission, onward)   None      Vitals/Pain Today's Vitals   12/18/18 0029 12/18/18 0030 12/18/18 0043 12/18/18 0045  BP: 137/74 140/82 123/72   Pulse: 77 79 81 83  Resp: 19 20 (!) 21 (!) 21  Temp: 98.3 F (36.8 C)     TempSrc: Oral     SpO2: 100% 100% 100% 99%  Weight:      Height:      PainSc:        Isolation Precautions No active isolations  Medications Medications  0.9 %  sodium chloride infusion (has no administration in time range)  medroxyPROGESTERone (PROVERA) tablet 20 mg (20 mg Oral Given 12/17/18 2229)    Mobility walks Low fall risk   Focused Assessments GU   R Recommendations: See Admitting Provider Note  Report given to:   Additional Notes:

## 2018-12-18 NOTE — Progress Notes (Signed)
Patient discharged home. Discharge instructions and prescriptions given and reviewed with patient. Patient verbalized understanding.  Escorted out by staff.   

## 2018-12-18 NOTE — Progress Notes (Signed)
Pt leaving unit with transport for MRI.

## 2018-12-18 NOTE — H&P (Signed)
GYNECOLOGY ADMISSION HISTORY AND PHYSICAL NOTE    Attending Provider: Dr. Marjean Donna  Sherlee Detlefsen [id number] 12/18/2018 1:21 AM    Chief Complaint:   Tiffany Rodriguez is a 48 y.o. [id number] premenopausal female seen at the request of Dr. Marjean Donna for evaluation of heavy uterine bleeding and resultant severe anemia requiring blood transfusion.    History of Present Ilness:   48 y.o. [id number] female who presented to the emergency department yesterday for heavy vaginal bleeding.  She began bleeding last Monday (about 10 days ago) and spotted for most of last week.  This past Sunday (about 4 days ago) she began having heavier bleeding and started passing clots.  Yesterday and the day of her presentation she was passing large clots and at one point she was soaking 1 pad per hour.  She began feeling lightheaded and dizzy and very short of breath with any walking.  She also began feeling weak and tired.  Due to the symptoms along with her anemia she presented for evaluation.  Her presenting hemoglobin was 5.3 and she has received 1 unit of packed red blood cells in the ER and is currently receiving her second unit.  Normally her menses come each month lasting about 3 days and are fairly light.  She denies intermenstrual bleeding.  She does note that about 8 years ago or so she had a similar episode and she presented with a hemoglobin of 3 and underwent a D&C.  She has been fine since that time.  Her last Pap smear was around the same time.  She notes that she believes the Pap smear was normal.  She denies a history of abnormal Pap smears and STDs.  She denies weight loss, early satiety, recent bloating, constipation.  Past Medical History:  Diagnosis Date  . Arthritis   . Asthma    Past Surgical History:  Procedure Laterality Date  . KNEE ARTHROSCOPY     Allergies:No Known Allergies   Prior to Admission medications   Medication Sig Start Date End Date Taking? Authorizing Provider   albuterol (PROVENTIL HFA;VENTOLIN HFA) 108 (90 Base) MCG/ACT inhaler Inhale 2 puffs into the lungs every 6 (six) hours as needed for wheezing or shortness of breath. 05/13/17   Sable Feil, PA-C    Social History:  She  reports that she has been smoking cigarettes. She has been smoking about 1.00 pack per day. She has never used smokeless tobacco. She reports that she does not drink alcohol or use drugs.  Family History:  family history includes Diabetes in her paternal grandmother.   Review of Systems:   Review of Systems  Constitutional: Positive for malaise/fatigue. Negative for chills, diaphoresis, fever and weight loss.  HENT: Negative.   Eyes: Negative.   Respiratory: Positive for shortness of breath. Negative for cough, hemoptysis, sputum production and wheezing.   Cardiovascular: Negative.   Gastrointestinal: Negative.   Genitourinary: Negative.        See HPI  Musculoskeletal: Negative.   Skin: Negative.   Neurological: Negative for dizziness, tingling, tremors, sensory change, speech change, focal weakness, seizures, loss of consciousness, weakness and headaches.  Psychiatric/Behavioral: Negative.      Objective    BP 123/72   Pulse 83   Temp 98.3 F (36.8 C) (Oral)   Resp (!) 21   Ht 5\' 3"  (1.6 m)   Wt 86.2 kg   LMP 12/14/2018   SpO2 99%   BMI 33.66 kg/m  Physical Exam Constitutional:  General: She is not in acute distress.    Appearance: Normal appearance. She is well-developed.  HENT:     Head: Normocephalic and atraumatic.  Eyes:     General: No scleral icterus.    Conjunctiva/sclera: Conjunctivae normal.  Neck:     Musculoskeletal: Normal range of motion and neck supple.     Thyroid: No thyromegaly.  Cardiovascular:     Rate and Rhythm: Normal rate and regular rhythm.     Heart sounds: Murmur (II/VI SEM) present. No friction rub. No gallop.   Pulmonary:     Effort: Pulmonary effort is normal.     Breath sounds: Normal breath sounds. No  wheezing, rhonchi or rales.  Abdominal:     General: There is no distension.     Palpations: Abdomen is soft. There is mass (over lower abdomen there is a mobile, non-tender mass that extends along the midline, but can be appreciated more laterally, as well. This end several centimeters below the umbilicus).     Tenderness: There is abdominal tenderness. There is no guarding or rebound.     Hernia: No hernia is present. There is no hernia in the left inguinal area.  Genitourinary:    Exam position: Supine.     Labia:        Right: No rash, tenderness or lesion.        Left: No rash, tenderness or lesion.      Comments: Exam declined by patient. Previously performed by ED physician who was unable to visualize the cervix. Musculoskeletal: Normal range of motion.  Skin:    General: Skin is warm and dry.     Findings: No rash.  Neurological:     General: No focal deficit present.     Mental Status: She is alert and oriented to person, place, and time.     Cranial Nerves: No cranial nerve deficit.  Psychiatric:        Mood and Affect: Mood normal.        Behavior: Behavior normal.        Judgment: Judgment normal.      Laboratory Results:   Lab Results  Component Value Date   WBC 10.1 12/17/2018   RBC 2.81 (L) 12/17/2018   HGB 5.2 (L) 12/17/2018   HCT 18.7 (L) 12/17/2018   PLT 243 12/17/2018   NA 137 12/17/2018   K 3.9 12/17/2018   CREATININE 0.79 12/17/2018   No results found for: PREGTESTUR, PREGSERUM, HCG, HCGQUANT  Imaging Results:  US Transvaginal Non-ob  Result Date: 12/18/2018 CLINICAL DATA:  Initial evaluation for acute vaginal bleeding. EXAM: TRANSABDOMINAL AND TRANSVAGINAL ULTRASOUND OF PELVIS DOPPLER ULTRASOUND OF OVARIES TECHNIQUE: Both transabdominal and transvaginal ultrasound examinations of the pelvis were performed. Transabdominal technique was performed for global imaging of the pelvis including uterus, ovaries, adnexal regions, and pelvic cul-de-sac. It was  necessary to proceed with endovaginal exam following the transabdominal exam to visualize the uterus, endometrium, and ovaries. Color and duplex Doppler ultrasound was utilized to evaluate blood flow to the ovaries. COMPARISON:  Prior ultrasound from 08/12/2006. FINDINGS: Uterus Measurements: 15.1 x 10.6 x 13.3 cm = volume: 1122.8 mL. Large probable fibroid within the central aspect of the uterus measures 9.6 x 6.9 x 10.5 cm, obscuring the underlying endometrium. Additional 1.5 x 1.5 x 1.4 cm intramural fibroid at the left uterine fundus. Endometrium Not visualized. Right ovary The native right ovary is not definitely seen. There is a large mildly complex cystic structure measuring 11.1 x 4.9  x 4.2 cm. Lesion is somewhat oblong an tubular morphology, with scattered low-level internal echoes. No appreciable vascularity. Left ovary The native left ovary is not definitely seen. There is a complex cystic structure within the left adnexa measuring 6.4 x 2.6 x 3.8 cm. Unclear whether this reflects a single complex cystic lesion multiple adjacent CIS, and/or possible hydrosalpinx. Evaluation for possible ovarian torsion limited on this examination. Pulsed Doppler interrogation of both adnexa demonstrates normal arterial and venous waveforms, with no definite evidence for torsion. Other findings No abnormal free fluid. IMPRESSION: 1. Enlarged fibroid uterus with dominant 10.5 cm fibroid within the central aspect of the uterus. 2. Nonvisualization of the endometrium, obscured by the overlying fibroid. 3. Complex cystic lesions within the bilateral adnexa, measuring up to 11.1 cm on the right and 6.4 cm on the left, indeterminate. Gynecologic referral for further workup and consultation recommended. 4. The underlying native ovaries are not well seen on this exam due to the adnexal cystic lesions. No obvious evidence for torsion. Electronically Signed   By: Jeannine Boga M.D.   On: 12/18/2018 00:25   US Pelvis  Complete  Result Date: 12/18/2018 CLINICAL DATA:  Initial evaluation for acute vaginal bleeding. EXAM: TRANSABDOMINAL AND TRANSVAGINAL ULTRASOUND OF PELVIS DOPPLER ULTRASOUND OF OVARIES TECHNIQUE: Both transabdominal and transvaginal ultrasound examinations of the pelvis were performed. Transabdominal technique was performed for global imaging of the pelvis including uterus, ovaries, adnexal regions, and pelvic cul-de-sac. It was necessary to proceed with endovaginal exam following the transabdominal exam to visualize the uterus, endometrium, and ovaries. Color and duplex Doppler ultrasound was utilized to evaluate blood flow to the ovaries. COMPARISON:  Prior ultrasound from 08/12/2006. FINDINGS: Uterus Measurements: 15.1 x 10.6 x 13.3 cm = volume: 1122.8 mL. Large probable fibroid within the central aspect of the uterus measures 9.6 x 6.9 x 10.5 cm, obscuring the underlying endometrium. Additional 1.5 x 1.5 x 1.4 cm intramural fibroid at the left uterine fundus. Endometrium Not visualized. Right ovary The native right ovary is not definitely seen. There is a large mildly complex cystic structure measuring 11.1 x 4.9 x 4.2 cm. Lesion is somewhat oblong an tubular morphology, with scattered low-level internal echoes. No appreciable vascularity. Left ovary The native left ovary is not definitely seen. There is a complex cystic structure within the left adnexa measuring 6.4 x 2.6 x 3.8 cm. Unclear whether this reflects a single complex cystic lesion multiple adjacent CIS, and/or possible hydrosalpinx. Evaluation for possible ovarian torsion limited on this examination. Pulsed Doppler interrogation of both adnexa demonstrates normal arterial and venous waveforms, with no definite evidence for torsion. Other findings No abnormal free fluid. IMPRESSION: 1. Enlarged fibroid uterus with dominant 10.5 cm fibroid within the central aspect of the uterus. 2. Nonvisualization of the endometrium, obscured by the overlying  fibroid. 3. Complex cystic lesions within the bilateral adnexa, measuring up to 11.1 cm on the right and 6.4 cm on the left, indeterminate. Gynecologic referral for further workup and consultation recommended. 4. The underlying native ovaries are not well seen on this exam due to the adnexal cystic lesions. No obvious evidence for torsion. Electronically Signed   By: Jeannine Boga M.D.   On: 12/18/2018 00:25   [place] Art/ven Flow Abd Pelv Doppler  Result Date: 12/18/2018 CLINICAL DATA:  Initial evaluation for acute vaginal bleeding. EXAM: TRANSABDOMINAL AND TRANSVAGINAL ULTRASOUND OF PELVIS DOPPLER ULTRASOUND OF OVARIES TECHNIQUE: Both transabdominal and transvaginal ultrasound examinations of the pelvis were performed. Transabdominal technique was performed for global imaging  of the pelvis including uterus, ovaries, adnexal regions, and pelvic cul-de-sac. It was necessary to proceed with endovaginal exam following the transabdominal exam to visualize the uterus, endometrium, and ovaries. Color and duplex Doppler ultrasound was utilized to evaluate blood flow to the ovaries. COMPARISON:  Prior ultrasound from 08/12/2006. FINDINGS: Uterus Measurements: 15.1 x 10.6 x 13.3 cm = volume: 1122.8 mL. Large probable fibroid within the central aspect of the uterus measures 9.6 x 6.9 x 10.5 cm, obscuring the underlying endometrium. Additional 1.5 x 1.5 x 1.4 cm intramural fibroid at the left uterine fundus. Endometrium Not visualized. Right ovary The native right ovary is not definitely seen. There is a large mildly complex cystic structure measuring 11.1 x 4.9 x 4.2 cm. Lesion is somewhat oblong an tubular morphology, with scattered low-level internal echoes. No appreciable vascularity. Left ovary The native left ovary is not definitely seen. There is a complex cystic structure within the left adnexa measuring 6.4 x 2.6 x 3.8 cm. Unclear whether this reflects a single complex cystic lesion multiple adjacent CIS,  and/or possible hydrosalpinx. Evaluation for possible ovarian torsion limited on this examination. Pulsed Doppler interrogation of both adnexa demonstrates normal arterial and venous waveforms, with no definite evidence for torsion. Other findings No abnormal free fluid. IMPRESSION: 1. Enlarged fibroid uterus with dominant 10.5 cm fibroid within the central aspect of the uterus. 2. Nonvisualization of the endometrium, obscured by the overlying fibroid. 3. Complex cystic lesions within the bilateral adnexa, measuring up to 11.1 cm on the right and 6.4 cm on the left, indeterminate. Gynecologic referral for further workup and consultation recommended. 4. The underlying native ovaries are not well seen on this exam due to the adnexal cystic lesions. No obvious evidence for torsion. Electronically Signed   By: Jeannine Boga M.D.   On: 12/18/2018 00:25      Assessment & Plan   Tiffany Rodriguez is a 48 y.o. [id number] premenopausal female with menorrhagia with irregular cycle with resultant severe anemia being seen in consultation.    Plan:  1.  Admit for observation 2.  Provera 20 mg PO TID for bleeding control. May switch to IV conjugated equine estrogen, if not successful. 3.  Will recheck CBC about 6 hours after current unit completes. She may require a further transfusion. 4.  Ultrasound findings were reviewed with the patient.  Importance of follow-up as an outpatient was encouraged and emphasized.  She will likely need a Pap smear along with an endometrial biopsy.  Longer-term management of her bleeding and her bilateral ovarian cysts will need to be addressed, as well. 5. Disposition: She will likely be discharged later today pending control of her bleeding and hemodynamic stability.   Prentice Docker, MD 12/18/2018 1:21 AM

## 2018-12-18 NOTE — Discharge Instructions (Signed)
When is menstrual bleeding considered heavy? Any of the following is considered to be heavy menstrual bleeding:  Bleeding that lasts more than 7 days. Bleeding that soaks through one or more tampons or pads every hour for several hours in a row. Needing to wear more than one pad at a time to control menstrual flow. Needing to change pads or tampons during the night. Menstrual flow with blood clots that are as big as a quarter or larger.    How can heavy menstrual bleeding affect my health? Heavy menstrual bleeding may be a sign of an underlying health problem that needs treatment. Blood loss from heavy periods also can lead to a condition called iron-deficiency anemia. Severe anemia can cause shortness of breath and increase the risk of heart problems.     What causes heavy menstrual bleeding? Many things can cause heavy menstrual bleeding. Some of the causes include the following:  Fibroids and polyps Adenomyosis Irregular ovulation--If you do not ovulate regularly, areas of the endometrium (the lining of the uterus) can become too thick. This condition is common during puberty and perimenopause. It also can occur in women with certain medical conditions, such as polycystic ovary syndrome and hypothyroidism. Bleeding disorders--When the blood does not clot properly, it can cause heavy bleeding. Medications--Blood thinners and aspirin can cause heavy menstrual bleeding. The copper intrauterine device (IUD) can cause heavier menstrual bleeding, especially during the first year of use. Cancer--Heavy menstrual bleeding can be an early sign of endometrial cancer. Most cases of endometrial cancer are diagnosed in women in their mid 39s who are past menopause. It often is diagnosed at an early stage when treatment is the most effective. Other causes--Endometriosis can cause heavy menstrual bleeding. Other causes include those related to pregnancy, such as ectopic pregnancy and miscarriage. Pelvic  inflammatory disease also can cause heavy menstrual bleeding. Sometimes, the cause is not known.    How is heavy menstrual bleeding evaluated? When you see your ob-gyn about heavy menstrual bleeding, you may be asked about the following things:  Past and present illnesses and surgical procedures Pregnancy history Medications, including those you buy over the counter Your birth control method Your menstrual cycle--If you can, use a calendar or period-tracking smartphone app to keep track of your menstrual cycle before your visit. Your ob-gyn will want to know detailed information about several menstrual cycles, including the dates that your period started, how long bleeding lasted, and the amount of flow (light, medium, heavy, or spotting).    What tests and exams may be used to evaluate heavy menstrual bleeding? You will have a physical exam, including a pelvic exam. Several laboratory tests may be done. You may have a pregnancy test and tests for some sexually transmitted infections. Based on your symptoms and your age, additional tests may be needed:  Ultrasound exam--Sound waves are used to make a picture of the pelvic organs. Hysteroscopy--A thin, lighted scope is inserted into the uterus through the opening of the cervix. It allows your ob-gyn to see the inside of the uterus. Endometrial biopsy--A sample of the endometrium is removed and looked at under a microscope. Sometimes hysteroscopy is used to guide this test. A surgical procedure called dilation and curettage (D&C) is another way this test can be done. Sonohysterography--Fluid is placed in the uterus through a thin tube while ultrasound images are made of the uterus. Magnetic resonance imaging--This imaging test uses powerful magnets to create images of the internal organs.    Which medications can  be used to treat heavy menstrual bleeding? Medications often are tried first to treat heavy menstrual bleeding:  Heavy bleeding  caused by problems with ovulation, endometriosis, polycystic ovary syndrome, and fibroids often can be managed with certain hormonal birth control methods. Depending on the type, these methods can lighten menstrual flow, help make periods more regular, or even stop bleeding completely. Hormone therapy can be helpful for heavy menstrual bleeding that occurs during perimenopause. Before deciding to use hormone therapy, it is important to weigh the benefits and risks (increased risk of heart attack, stroke, and cancer). Gonadotropin-releasing hormone (GnRH) agonists stop the menstrual cycle and reduce the size of fibroids. They are used only for short periods (less than 6 months). Their effect on fibroids is temporary. Once you stop taking the drug, fibroids usually return to their original size. Tranexamic acid is a prescription medication that treats heavy menstrual bleeding. It comes in a tablet and is taken each month at the start of the menstrual period. Nonsteroidal antiinflammatory drugs, such as ibuprofen, also may help control heavy bleeding and relieve menstrual cramps. If you have a bleeding disorder, your treatment may include special medications to help your blood clot.    Which procedures can be used to treat heavy menstrual bleeding? If medication does not reduce your bleeding, a surgical procedure may be needed:  Endometrial ablation destroys the lining of the uterus. It stops or reduces menstrual bleeding. Pregnancy is not likely after ablation, but it can happen. If it does, the risk of serious complications is greatly increased. You will need to use a birth control method until after menopause following endometrial ablation. Sterilization (permanent birth control) may be a good option to prevent pregnancy for women having ablation. Endometrial ablation should be considered only after medication or other therapies have not worked. Uterine artery embolization ([place]) is used to treat fibroids.  In [place], the blood vessels to the uterus are blocked, which stops the blood flow that allows fibroids to grow. Myomectomy is surgery to remove fibroids without removing the uterus. Hysteroscopy can be used to remove fibroids or stop bleeding caused by fibroids in some cases. Hysteroscopy can be used to remove fibroids or stop bleeding caused by fibroids in some cases. Hysterectomyis surgical removal of the uterus. Hysterectomy is used to treat fibroids and adenomyosis when other types of treatment have failed or are not an option. It also is used to treat endometrial cancer. After the uterus is removed, a woman can no longer get pregnant and will no longer have periods.    Glossary Adenomyosis: A condition in which the tissue that normally lines the uterus begins to grow in the muscle wall of the uterus.  Cervix: The lower, narrow end of the uterus at the top of the vagina.  Dilation and Curettage (D&C): A procedure in which the cervix is opened (dilated) and tissue is gently scraped (curettage) or suctioned from the inside of the uterus.  Ectopic Pregnancy: A pregnancy in which the fertilized egg begins to grow in a place other than inside the uterus, usually in one of the fallopian tubes.  Endometrial Ablation: A minor surgical procedure in which the lining of the uterus is destroyed to stop or reduce menstrual bleeding.  Endometrial Cancer: Cancer of the lining of the uterus.  Endometrial Biopsy: A procedure in which a small amount of the tissue lining the uterus is removed and examined under a microscope.  Endometriosis: A condition in which tissue that lines the uterus is found outside  of the uterus, usually on the ovaries, fallopian tubes, and other pelvic structures.  Endometrium: The lining of the uterus.  Fibroids: Growths, usually benign, that form in the muscle of the uterus.  Gonadotropin-releasing Hormone [facility]) Agonists: Medical therapy used to block the effects of certain  hormones.  Hormone Therapy: Treatment in which estrogen and often progestin are taken to help relieve some of the symptoms caused by low levels of these hormones.  Hypothyroidism: A condition in which the thyroid gland makes too little thyroid hormone.  Hysterectomy: Removal of the uterus.  Hysteroscopy: A procedure in which a device called a hysteroscope is inserted into the uterus through the cervix to view the inside of the uterus or perform surgery.  Intrauterine Device (IUD): A small device that is inserted and left inside the uterus to prevent pregnancy.  Iron-Deficiency Anemia: Abnormally low levels of iron, which is the part of the red blood cells that carries oxygen to the cells and tissues of the body.  Magnetic Resonance Imaging: A method of viewing internal organs and structures by using a strong magnetic field and sound waves.  Menopause: The time in a womans life when menstruation stops; defined as the absence of menstrual periods for 1 year.  Menstrual Cycle: The monthly process of changes that occur to prepare a womans body for possible pregnancy. A menstrual cycle is defined from the first day of menstrual bleeding of one cycle to the first day of menstrual bleeding of the next cycle.  Miscarriage: Loss of a pregnancy that occurs in the first 13 weeks of pregnancy.  Myomectomy: Surgical removal of uterine fibroids only, leaving the uterus in place.  Nonsteroidal Antiinflammatory Drugs: A type of pain reliever that relieves pain by reducing inflammation. Many types are available over the counter.  Obstetrician-Gynecologist (Ob-Gyn): A physician with special skills, training, and education in womens health.  Ovulation: The release of an egg from one of the ovaries.  Pelvic Exam: A physical examination of a womans reproductive organs.  Pelvic Inflammatory Disease: An infection of the uterus, fallopian tubes, and nearby pelvic structures.  Perimenopause: The period  before menopause that usually extends from age 33 years to 60 years.  Polycystic Ovary Syndrome: A condition characterized by two of the following three features: the presence of growths called cysts on the ovaries, irregular menstrual periods, and an increase in the levels of certain hormones.  Polyps: Benign (noncancerous) growths that develop from tissue lining an organ, such as that lining the inside of the uterus.  Puberty: The stage of life when the reproductive organs become functional and secondary sex characteristics develop.  Sexually Transmitted Infections: Infections that are spread by sexual contact, including chlamydia, gonorrhea, human papillomavirus, herpes, syphilis, and human immunodeficiency virus (HIV, the cause of acquired immunodeficiency syndrome [AIDS]).  Sonohysterography: A procedure in which sterile fluid is injected into the uterus through the cervix while ultrasound images are taken of the inside of the uterus.  Sterilization: A permanent method of birth control.  Tranexamic Acid: A medication prescribed to treat or prevent heavy bleeding.  Ultrasound Exam: A test in which sound waves are used to examine internal structures. During pregnancy, it can be used to examine the fetus.  Uterine Artery Embolization ([place]): A procedure in which the blood vessels to the uterus are blocked. It is used to treat postpartum hemorrhage and other problems that cause uterine bleeding.  Uterus: A muscular organ located in the female pelvis that contains and nourishes the developing fetus during pregnancy.  Hysterectomy Information  A hysterectomy is a surgery to remove your uterus. After surgery, you will no longer have periods. Also, you will no longer be able to get pregnant. Reasons for this surgery You may have this surgery if:  You have bleeding in your vagina: ? That is not normal. ? That does not stop, or that keeps coming back.  You have long-term (chronic)  pain in your lower belly (pelvic area).  The lining of your uterus grows outside of the uterus (endometriosis).  The lining of your uterus grows in the muscle of the uterus (adenomyosis).  Your uterus falls down into your vagina (prolapse).  You have a growth in your uterus that causes problems (uterine fibroids).  You have cells that could turn into cancer (precancerous cells).  You have cancer of the uterus or cervix. Types of hysterectomies There are 3 types of hysterectomies. Depending on the type, the surgery will:  Remove the top part of the uterus (supracervical).  Remove the uterus and the cervix (total).  Remove the uterus, cervix, and tissue that holds the uterus in place (radical). Ways a hysterectomy can be done This surgery may be done in one of these ways:  A cut (incision) is made in the belly (abdomen). The uterus is taken out through the cut.  A cut is made in the vagina. The uterus is taken out through the cut.  Three or four cuts are made in the belly. A device with a camera is put through one of the cuts. The uterus is cut into pieces and taken out through the cuts or the vagina.  Three or four cuts are made in the belly. A device with a camera is put through one of the cuts. The uterus is taken out through the vagina.  Three or four cuts are made in the belly. A computer helps control the surgical tools. The uterus is cut into small pieces. The pieces are taken out through the cuts or through the vagina. Talk with your doctor about which way is best for you. Risks of hysterectomy Generally, this surgery is safe. However, problems can happen, including:  Bleeding.  Needing donated blood (transfusion).  Blood clots.  Infection.  Damage to other structures or organs.  Allergic reactions.  Needing to switch to a different type of surgery. What to expect after surgery  You will be given pain medicine.  You will need to stay in the hospital for 1-2  days.  Follow your doctor's instructions about: ? Exercising. ? Driving. ? What activities are safe for you.  You will need to have someone with you at home for 3-5 days.  You will need to see your doctor after 2-4 weeks.  You may get hot flashes, have night sweats, and have trouble sleeping.  You may need to have Pap tests if your surgery was related to cancer. Talk with your doctor about how often you need Pap tests. Questions to ask your doctor  Do I need this surgery? Do I have other treatment options?  What are my options for this surgery?  What needs to be removed?  What are the risks?  What are the benefits?  How long will I need to stay in the hospital?  How long will I need to recover?  What symptoms can I expect after the procedure? Summary  A hysterectomy is a surgery to remove your uterus. After surgery, you will no longer have periods. Also, you will no longer be able  to get pregnant.  Talk with your doctor about which type of hysterectomy is best for you. This information is not intended to replace advice given to you by your health care provider. Make sure you discuss any questions you have with your health care provider. Document Released: 06/04/2011 Document Revised: 05/15/2018 Document Reviewed: 06/12/2016 Elsevier Patient Education  2020 [employer].

## 2018-12-19 LAB — BPAM RBC
Blood Product Expiration Date: 202010202359
Blood Product Expiration Date: 202010202359
ISSUE DATE / TIME: 202009232137
ISSUE DATE / TIME: 202009240018
Unit Type and Rh: 6200
Unit Type and Rh: 6200

## 2018-12-19 LAB — TYPE AND SCREEN
ABO/RH(D): AB POS
Antibody Screen: NEGATIVE
Unit division: 0
Unit division: 0

## 2018-12-19 LAB — POSTMENOPAUSAL INTERP: HIGH

## 2018-12-19 LAB — OVARIAN MALIGNANCY RISK-ROMA
Cancer Antigen (CA) 125: 152 U/mL — ABNORMAL HIGH (ref 0.0–38.1)
HE4: 64.5 pmol/L — ABNORMAL HIGH (ref 0.0–63.6)
Postmenopausal ROMA: 4.8 — ABNORMAL HIGH
Premenopausal ROMA: 1.46 — ABNORMAL HIGH

## 2018-12-19 LAB — HIV ANTIBODY (ROUTINE TESTING W REFLEX): HIV Screen 4th Generation wRfx: NONREACTIVE

## 2018-12-19 LAB — PREMENOPAUSAL INTERP: HIGH

## 2018-12-19 NOTE — Progress Notes (Signed)
FYI

## 2018-12-24 ENCOUNTER — Other Ambulatory Visit: Payer: Self-pay

## 2018-12-24 ENCOUNTER — Other Ambulatory Visit (HOSPITAL_COMMUNITY)
Admission: RE | Admit: 2018-12-24 | Discharge: 2018-12-24 | Disposition: A | Discharge status: Home / Self Care | Payer: Self-pay | Source: Ambulatory Visit | Attending: Obstetrics and Gynecology | Admitting: Obstetrics and Gynecology

## 2018-12-24 ENCOUNTER — Ambulatory Visit (INDEPENDENT_AMBULATORY_CARE_PROVIDER_SITE_OTHER): Payer: Self-pay | Admitting: Obstetrics and Gynecology

## 2018-12-24 ENCOUNTER — Encounter: Payer: Self-pay | Admitting: Obstetrics and Gynecology

## 2018-12-24 VITALS — BP 134/82 | HR 75 | Ht 63.0 in | Wt 191.0 lb

## 2018-12-24 DIAGNOSIS — R978 Other abnormal tumor markers: Secondary | ICD-10-CM

## 2018-12-24 DIAGNOSIS — N8003 Adenomyosis of the uterus: Secondary | ICD-10-CM

## 2018-12-24 DIAGNOSIS — Z124 Encounter for screening for malignant neoplasm of cervix: Secondary | ICD-10-CM

## 2018-12-24 DIAGNOSIS — N939 Abnormal uterine and vaginal bleeding, unspecified: Secondary | ICD-10-CM

## 2018-12-24 DIAGNOSIS — N809 Endometriosis, unspecified: Secondary | ICD-10-CM

## 2018-12-24 DIAGNOSIS — D500 Iron deficiency anemia secondary to blood loss (chronic): Secondary | ICD-10-CM

## 2018-12-24 NOTE — Progress Notes (Signed)
Patient ID: Tiffany Rodriguez, female   DOB: 1971/02/13, 48 y.o.   MRN: [id number]  Reason for Consult: Follow-up (Low hemoglobin, heavy period and large clots, bleeding still but lighter )   Referred by No ref. provider found  Subjective:     HPI:  Tiffany Rodriguez is a 48 y.o. female. She is following up from her recent hospitalization for acute on chronic blood loss anemia related to abnormal uterine bleeding.  Se was treated in the hospital with oral high dose progesterone with resolved her acute heavy bleeding. She was discharged home with a progesterone taper. She has not been taking it as prescribed but reports her bleeding is minimal (spotting).  In the hospital she had a MRI which showed a significantly enlarged uterus with adenomyosis, 4 subserosal fibroids largest of which was 3.2 centimeters, bilateral hydrosalpix, and ovaries were difficult to visualize. Pelvic US also did not clearly identify the ovaries.  She was transfused with 2 units of PRBC and received 1 dose of IV iron.  She did have a ROMA which was elevated, but this was drawn after her blood transfusion.   This is her second acute bleeding event which has required a blood transfusion.  She reports a hospitalization in 2006  for a hemoglobin of 3.  She reports that at that time she had dizziness, exhaustion, was fatigued when walking short distances. This was after she has bleeding every day for 2 months. She underwent a D&C which stopped the bleeding.   Review of the medical record suggest she also had a D&C for AUB in 2012.  Gynecological History Menarche: 13 Menopause: not applicable Describes periods as History of regular montly periods until about 1.5 years ago. Recently her periods have been irregular and heavy.  Denies a history of fibroids or endometriosis.  Last pap smear: Unknown, more than 10 years ago, possible 2006 Last Mammogram: Never History of STDs: Remote hx of gonorrhea at 70 years age.  Treated for PID in 2004 and 2005  Sexually Active: Not in 10 + years. History of dyspareunia.   Obstetrical History [id number] History of 2 full term SVDs.    Past Medical History:  Diagnosis Date   Anemia    Arthritis    right knee   Asthma    Family History  Problem Relation Age of Onset   Diabetes Paternal Grandmother    Leukemia Paternal Uncle    Leukemia Paternal Grandfather    Denies a family history of breast, uterine, ovarian and colon cancer  Past Surgical History:  Procedure Laterality Date   DILATION AND CURETTAGE OF UTERUS  2006   KNEE ARTHROSCOPY      Short Social History:  Social History   Tobacco Use   Smoking status: Current Every Day Smoker    Packs/day: 1.00    Types: Cigarettes   Smokeless tobacco: Never Used  Substance Use Topics   Alcohol use: No    No Known Allergies  Current Outpatient Medications  Medication Sig Dispense Refill   medroxyPROGESTERone (PROVERA) 10 MG tablet Take 2 tablets (20 mg total) by mouth every 8 (eight) hours. Take 2 tablets three times a day for 7 days.: 42 Take 2 tablets two times a day for 7 days: 28 Take 2 tablets once a day for 7 days: 14 Take one tablet a day until directed to stop: 30 120 tablet 3   albuterol (PROVENTIL HFA;VENTOLIN HFA) 108 (90 Base) MCG/ACT inhaler Inhale 2 puffs into the lungs every 6 (six)  hours as needed for wheezing or shortness of breath. 1 Inhaler 2   ibuprofen (ADVIL) 200 MG tablet Take 400 mg by mouth every 6 (six) hours as needed.     No current facility-administered medications for this visit.     Review of Systems  Constitutional: Positive for fatigue. Negative for chills, fever and unexpected weight change.  HENT: Negative for trouble swallowing.  Eyes: Negative for loss of vision.  Respiratory: Negative for cough, shortness of breath and wheezing.  Cardiovascular: Negative for chest pain, leg swelling, palpitations and syncope.  GI: Negative for abdominal pain,  blood in stool, diarrhea, nausea and vomiting.  GU: Negative for difficulty urinating, dysuria, frequency and hematuria.  Musculoskeletal: Negative for back pain, leg pain and joint pain.  Skin: Negative for rash.  Neurological: Negative for dizziness, headaches, light-headedness, numbness and seizures.  Psychiatric: Negative for behavioral problem, confusion, depressed mood and sleep disturbance.        Objective:  Objective   Vitals:   12/24/18 1325  BP: 134/82  Pulse: 75  Weight: 191 lb (86.6 kg)  Height: 5\' 3"  (1.6 m)   Body mass index is 33.83 kg/m.  Physical Exam Vitals signs and nursing note reviewed.  Constitutional:      Appearance: She is well-developed.  HENT:     Head: Normocephalic and atraumatic.  Eyes:     Pupils: Pupils are equal, round, and reactive to light.  Cardiovascular:     Rate and Rhythm: Normal rate and regular rhythm.  Pulmonary:     Effort: Pulmonary effort is normal. No respiratory distress.  Genitourinary:    Comments: External: Vulva normal. No lesions noted.  Speculum examination:  Cervix grossly normal  . Scant dark red blood in the vaginal vault. no discharge.   Bimanual examination: Uterus enlarged and filling the pelvis and lower abdomen. Uterus extending to umbilicus and laterally. Globular in shape, smooth contour.  Pelvis is somewhat fixed which may be secondary to size of uterus. Bilateral adnexal fullness.  No CMT.    Skin:    General: Skin is warm and dry.  Neurological:     Mental Status: She is alert and oriented to person, place, and time.  Psychiatric:        Behavior: Behavior normal.        Thought Content: Thought content normal.        Judgment: Judgment normal.   Endometrial Biopsy After discussion with the patient regarding her abnormal uterine bleeding I recommended that she proceed with an endometrial biopsy for further diagnosis. The risks, benefits, alternatives, and indications for an endometrial biopsy were  discussed with the patient in detail. She understood the risks including infection, bleeding, cervical laceration and uterine perforation.  Verbal consent was obtained.   PROCEDURE NOTE:  [place] endometrial biopsy was performed using aseptic technique with iodine preparation.  The uterus was sounded to a length of 13 cm.  Adequate sampling was obtained with minimal blood loss.  The patient tolerated the procedure well.  Disposition will be pending pathology.      Assessment/Plan:    48 yo with abnormal uterine bleeding 1) Has had at least 2 significant bleeding events which have lead to her needing emergency care and multiple blood transfusions.  Her bleeding is currently being controlled with oral progesterone which we will plan to continue.  Discussed options for surgical versus medical management and patient wishes to proceed with a hysterectomy and bilateral salpingectomy, possible oophorectomy which may be necessitated if  ovaries are grossly abnormal to technical surgical challenges.  Discussed that given the size of her uterus she may need a laparotomy. Would also given consideration to placement of ureteral stents. Patient is agreeable to this plan.  2) Endometrial biopsy and pap smear today.   3) Likely benign pathology but given the potential surgical difficulty secondary to the size of her uterus and the hydrosalpinx will involve gynecological oncology in her care for surgical co-managment. 4)  Will refer to hematology for IV iron therapy in lead up to surgery to maximize her hemoglobin prior to a surgical procedure. 5) Encouraged smoking cessation   More than 40 minutes were spent face to face with the patient in the room with more than 50% of the time spent providing counseling and discussing the plan of management.    Adrian Prows MD [facility] 12/30/2018 11:53 PM

## 2018-12-26 LAB — SURGICAL PATHOLOGY

## 2018-12-29 ENCOUNTER — Other Ambulatory Visit: Payer: Self-pay

## 2018-12-29 NOTE — Progress Notes (Signed)
Called patient, no answer, left generic message.  Normal result.

## 2018-12-30 ENCOUNTER — Encounter: Payer: Self-pay | Admitting: Obstetrics and Gynecology

## 2018-12-30 ENCOUNTER — Other Ambulatory Visit: Payer: Self-pay

## 2018-12-30 ENCOUNTER — Inpatient Hospital Stay: Payer: Self-pay | Attending: Oncology | Admitting: Oncology

## 2018-12-30 ENCOUNTER — Encounter: Payer: Self-pay | Admitting: Oncology

## 2018-12-30 ENCOUNTER — Inpatient Hospital Stay: Payer: Self-pay

## 2018-12-30 DIAGNOSIS — N800 Endometriosis of uterus: Secondary | ICD-10-CM | POA: Insufficient documentation

## 2018-12-30 DIAGNOSIS — D509 Iron deficiency anemia, unspecified: Secondary | ICD-10-CM | POA: Insufficient documentation

## 2018-12-30 DIAGNOSIS — D251 Intramural leiomyoma of uterus: Secondary | ICD-10-CM | POA: Insufficient documentation

## 2018-12-30 DIAGNOSIS — Z791 Long term (current) use of non-steroidal anti-inflammatories (NSAID): Secondary | ICD-10-CM | POA: Insufficient documentation

## 2018-12-30 DIAGNOSIS — F17210 Nicotine dependence, cigarettes, uncomplicated: Secondary | ICD-10-CM | POA: Insufficient documentation

## 2018-12-30 DIAGNOSIS — N920 Excessive and frequent menstruation with regular cycle: Secondary | ICD-10-CM | POA: Insufficient documentation

## 2018-12-30 DIAGNOSIS — N7011 Chronic salpingitis: Secondary | ICD-10-CM | POA: Insufficient documentation

## 2018-12-30 DIAGNOSIS — D252 Subserosal leiomyoma of uterus: Secondary | ICD-10-CM | POA: Insufficient documentation

## 2018-12-30 DIAGNOSIS — Z79899 Other long term (current) drug therapy: Secondary | ICD-10-CM | POA: Insufficient documentation

## 2018-12-30 DIAGNOSIS — R531 Weakness: Secondary | ICD-10-CM | POA: Insufficient documentation

## 2018-12-30 DIAGNOSIS — Z793 Long term (current) use of hormonal contraceptives: Secondary | ICD-10-CM | POA: Insufficient documentation

## 2018-12-30 DIAGNOSIS — E538 Deficiency of other specified B group vitamins: Secondary | ICD-10-CM | POA: Insufficient documentation

## 2018-12-30 DIAGNOSIS — R5383 Other fatigue: Secondary | ICD-10-CM | POA: Insufficient documentation

## 2018-12-30 DIAGNOSIS — R42 Dizziness and giddiness: Secondary | ICD-10-CM | POA: Insufficient documentation

## 2018-12-30 LAB — COMPREHENSIVE METABOLIC PANEL
ALT: 13 U/L (ref 0–44)
AST: 16 U/L (ref 15–41)
Albumin: 3.6 g/dL (ref 3.5–5.0)
Alkaline Phosphatase: 43 U/L (ref 38–126)
Anion gap: 7 (ref 5–15)
BUN: 12 mg/dL (ref 6–20)
CO2: 23 mmol/L (ref 22–32)
Calcium: 9.1 mg/dL (ref 8.9–10.3)
Chloride: 108 mmol/L (ref 98–111)
Creatinine, Ser: 0.7 mg/dL (ref 0.44–1.00)
GFR calc Af Amer: >60 mL/min (ref >60)
GFR calc non Af Amer: >60 mL/min (ref >60)
Glucose, Bld: 88 mg/dL (ref 70–99)
Potassium: 3.6 mmol/L (ref 3.5–5.1)
Sodium: 138 mmol/L (ref 135–145)
Total Bilirubin: 0.2 mg/dL — ABNORMAL LOW (ref 0.3–1.2)
Total Protein: 6.4 g/dL — ABNORMAL LOW (ref 6.5–8.1)

## 2018-12-30 LAB — IRON AND TIBC
Iron: 11 ug/dL — ABNORMAL LOW (ref 28–170)
Saturation Ratios: 2 % — ABNORMAL LOW (ref 10.4–31.8)
TIBC: 458 ug/dL — ABNORMAL HIGH (ref 250–450)
UIBC: 447 ug/dL

## 2018-12-30 LAB — CBC WITH DIFFERENTIAL/PLATELET
Abs Immature Granulocytes: 0.03 10*3/uL (ref 0.00–0.07)
Basophils Absolute: 0.1 10*3/uL (ref 0.0–0.1)
Basophils Relative: 1 %
Eosinophils Absolute: 0.2 10*3/uL (ref 0.0–0.5)
Eosinophils Relative: 2 %
HCT: 26.1 % — ABNORMAL LOW (ref 36.0–46.0)
Hemoglobin: 7.3 g/dL — ABNORMAL LOW (ref 12.0–15.0)
Immature Granulocytes: 0 %
Lymphocytes Relative: 18 %
Lymphs Abs: 1.3 10*3/uL (ref 0.7–4.0)
MCH: 20.7 pg — ABNORMAL LOW (ref 26.0–34.0)
MCHC: 28 g/dL — ABNORMAL LOW (ref 30.0–36.0)
MCV: 73.9 fL — ABNORMAL LOW (ref 80.0–100.0)
Monocytes Absolute: 0.4 10*3/uL (ref 0.1–1.0)
Monocytes Relative: 6 %
Neutro Abs: 5.4 10*3/uL (ref 1.7–7.7)
Neutrophils Relative %: 73 %
Platelets: 306 10*3/uL (ref 150–400)
RBC: 3.53 MIL/uL — ABNORMAL LOW (ref 3.87–5.11)
RDW: 23.7 % — ABNORMAL HIGH (ref 11.5–15.5)
WBC: 7.4 10*3/uL (ref 4.0–10.5)
nRBC: 0 % (ref 0.0–0.2)

## 2018-12-30 LAB — FOLATE: Folate: 14.1 ng/mL (ref >5.9)

## 2018-12-30 LAB — TSH: TSH: 1.49 u[IU]/mL (ref 0.350–4.500)

## 2018-12-30 LAB — RETICULOCYTES
Immature Retic Fract: 7.8 % (ref 2.3–15.9)
RBC.: 3.53 MIL/uL — ABNORMAL LOW (ref 3.87–5.11)
Retic Count, Absolute: 46.2 10*3/uL (ref 19.0–186.0)
Retic Ct Pct: 1.3 % (ref 0.4–3.1)

## 2018-12-30 LAB — CYTOLOGY - PAP
Diagnosis: NEGATIVE
High risk HPV: NEGATIVE

## 2018-12-30 LAB — FERRITIN: Ferritin: 9 ng/mL — ABNORMAL LOW (ref 11–307)

## 2018-12-30 LAB — VITAMIN B12: Vitamin B-12: 211 pg/mL (ref 180–914)

## 2018-12-30 LAB — SAMPLE TO BLOOD BANK

## 2018-12-30 NOTE — Progress Notes (Signed)
Hematology/Oncology Consult note [facility] Telephone:([phone] [phone]  Patient Care Team: Patient, No Pcp Per as PCP - General (General Practice) Clent Jacks, RN as Oncology Nurse Navigator   Name of the patient: Tiffany Rodriguez  GX:[id number]  Mar 16, 1971    Reason for referral- Dr. Gilman Schmidt   Referring physician- anemia  Date of visit: 12/30/18   History of presenting illness-patient is a 48 year old African-American female referred to [place] for anemia.  She has been following up with [facility] OB[facility] Dr. Gilman Schmidt for abnormal uterine bleeding.Most recent hemoglobin from 12/18/2018 showed white count of 6.4, H&H of 7.1/23.4 with an MCV of 71 and a platelet count of 177.  She is also received blood transfusion recently.  Endometrial biopsy showed no evidence of hyperplasia or malignancy.  Patient states she has had  Irregular and heavy menstrual bleeding that has been on and off for many years but particularly worse since 1 year. She is strongly considering hysterectomy at this time. She had dizzy spells when she was admitetd with hb of 5.2 but those symptoms have improved since blood transfusion. She feels fatigued. She still has ongoing menstrual bleeding  ECOG PS- 1  Pain scale- 0   Review of systems- Review of Systems  Constitutional: Positive for malaise/fatigue. Negative for chills, fever and weight loss.  HENT: Negative for congestion, ear discharge and nosebleeds.   Eyes: Negative for blurred vision.  Respiratory: Negative for cough, hemoptysis, sputum production, shortness of breath and wheezing.   Cardiovascular: Negative for chest pain, palpitations, orthopnea and claudication.  Gastrointestinal: Negative for abdominal pain, blood in stool, constipation, diarrhea, heartburn, melena, nausea and vomiting.  Genitourinary: Negative for dysuria, flank pain, frequency, hematuria and urgency.  Musculoskeletal: Negative for back pain,  joint pain and myalgias.  Skin: Negative for rash.  Neurological: Negative for dizziness, tingling, focal weakness, seizures, weakness and headaches.  Endo/Heme/Allergies: Does not bruise/bleed easily.  Psychiatric/Behavioral: Negative for depression and suicidal ideas. The patient does not have insomnia.     No Known Allergies  Patient Active Problem List   Diagnosis Date Noted   Menorrhagia with irregular cycle 12/18/2018   Anemia associated with acute blood loss 12/18/2018   Fibroid uterus 12/18/2018   Bilateral ovarian cysts 12/18/2018   Severe anemia 12/18/2018     Past Medical History:  Diagnosis Date   Anemia    Arthritis    Asthma      Past Surgical History:  Procedure Laterality Date   DILATION AND CURETTAGE OF UTERUS  2006   KNEE ARTHROSCOPY      Social History   Socioeconomic History   Marital status: Single    Spouse name: Not on file   Number of children: Not on file   Years of education: Not on file   Highest education level: Not on file  Occupational History   Not on file  Social Needs   Financial resource strain: Not on file   Food insecurity    Worry: Not on file    Inability: Not on file   Transportation needs    Medical: Not on file    Non-medical: Not on file  Tobacco Use   Smoking status: Current Every Day Smoker    Packs/day: 1.00    Types: Cigarettes   Smokeless tobacco: Never Used  Substance and Sexual Activity   Alcohol use: No   Drug use: Never   Sexual activity: Not Currently    Birth control/protection: None  Lifestyle   Physical  activity    Days per week: Not on file    Minutes per session: Not on file   Stress: Not on file  Relationships   Social connections    Talks on phone: Not on file    Gets together: Not on file    Attends religious service: Not on file    Active member of club or organization: Not on file    Attends meetings of clubs or organizations: Not on file    Relationship  status: Not on file   Intimate partner violence    Fear of current or ex partner: Not on file    Emotionally abused: Not on file    Physically abused: Not on file    Forced sexual activity: Not on file  Other Topics Concern   Not on file  Social History Narrative   Not on file     Family History  Problem Relation Age of Onset   Diabetes Paternal Grandmother    Cancer Paternal Uncle      Current Outpatient Medications:    albuterol (PROVENTIL HFA;VENTOLIN HFA) 108 (90 Base) MCG/ACT inhaler, Inhale 2 puffs into the lungs every 6 (six) hours as needed for wheezing or shortness of breath. (Patient not taking: Reported on 12/24/2018), Disp: 1 Inhaler, Rfl: 2   medroxyPROGESTERone (PROVERA) 10 MG tablet, Take 2 tablets (20 mg total) by mouth every 8 (eight) hours. Take 2 tablets three times a day for 7 days.: 42 Take 2 tablets two times a day for 7 days: 28 Take 2 tablets once a day for 7 days: 14 Take one tablet a day until directed to stop: 30, Disp: 120 tablet, Rfl: 3   Physical exam: There were no vitals filed for this visit. Physical Exam HENT:     Head: Normocephalic and atraumatic.  Eyes:     Pupils: Pupils are equal, round, and reactive to light.  Neck:     Musculoskeletal: Normal range of motion.  Cardiovascular:     Rate and Rhythm: Normal rate and regular rhythm.     Heart sounds: Normal heart sounds.  Pulmonary:     Effort: Pulmonary effort is normal.     Breath sounds: Normal breath sounds.  Abdominal:     General: Bowel sounds are normal.     Palpations: Abdomen is soft.  Skin:    General: Skin is warm and dry.  Neurological:     Mental Status: She is alert and oriented to person, place, and time.        CMP Latest Ref Rng & Units 12/17/2018  Glucose 70 - 99 mg/dL 122(H)  BUN 6 - 20 mg/dL 15  Creatinine 0.44 - 1.00 mg/dL 0.79  Sodium 135 - 145 mmol/L 137  Potassium 3.5 - 5.1 mmol/L 3.9  Chloride 98 - 111 mmol/L 106  CO2 22 - 32 mmol/L 23  Calcium  8.9 - 10.3 mg/dL 8.5(L)   CBC Latest Ref Rng & Units 12/18/2018  WBC 4.0 - 10.5 K/uL 6.4  Hemoglobin 12.0 - 15.0 g/dL 7.1(L)  Hematocrit 36.0 - 46.0 % 23.4(L)  Platelets 150 - 400 K/uL 177    No images are attached to the encounter.  Mr Pelvis W Wo Contrast  Result Date: 12/18/2018 CLINICAL DATA:  Evaluate pelvic mass. EXAM: MRI PELVIS WITHOUT AND WITH CONTRAST TECHNIQUE: Multiplanar multisequence MR imaging of the pelvis was performed both before and after administration of intravenous contrast. CONTRAST:  17mL GADAVIST GADOBUTROL 1 MMOL/ML IV SOLN COMPARISON:  Ultrasound 12/17/18  FINDINGS: Urinary Tract:  No abnormality visualized. Bowel:  Unremarkable visualized pelvic bowel loops. Vascular/Lymphatic: No pathologically enlarged lymph nodes. No significant vascular abnormality seen. Reproductive: Uterus: 16.4 by 13.8 by 11.4 cm (volume = 1350 cm^3). The uterus is diffusely enlarged. Marked thickening of the junctional zone is noted. Posteriorly this measures 6.2 cm in thickness. Anteriorly this measures 2.7 cm, image 17/5. Throughout the diffusely thickened junctional zone are multiple small T2 hyperintense foci reflecting small cystic areas. Findings are most consistent with diffuse adenomyosis. There are several well-circumscribed, ovoid subserosal lesions identified along the posterior, anterior and bilateral myometrium. These appear T1 hypointense, mildly T2 hyperintense and shows uniform enhancement following contrast administration. Consistent with subserosal fibroids. The largest of these is along the anterior myometrium measuring 3.2 cm in maximum dimension. Right ovary: Not confidently identified. Marked right sided hydrosalpinx is identified with a maximum diameter of 3.6 cm. Left ovary: Not confidently identified. Left hydrosalpinx is also present measuring up to 1.9 cm, image 22/3. Other:  No free fluid or fluid collections Musculoskeletal: No suspicious bone lesions identified. IMPRESSION:  1. Severe, diffuse adenomyosis. 2. At least 4 subserosal fibroids are identified measuring up to 3.2 cm. 3. Bilateral hydrosalpinx.  Ovaries not confidently identified. Electronically Signed   By: Kerby Moors M.D.   On: 12/18/2018 11:21   US Transvaginal Non-ob  Result Date: 12/18/2018 CLINICAL DATA:  Initial evaluation for acute vaginal bleeding. EXAM: TRANSABDOMINAL AND TRANSVAGINAL ULTRASOUND OF PELVIS DOPPLER ULTRASOUND OF OVARIES TECHNIQUE: Both transabdominal and transvaginal ultrasound examinations of the pelvis were performed. Transabdominal technique was performed for global imaging of the pelvis including uterus, ovaries, adnexal regions, and pelvic cul-de-sac. It was necessary to proceed with endovaginal exam following the transabdominal exam to visualize the uterus, endometrium, and ovaries. Color and duplex Doppler ultrasound was utilized to evaluate blood flow to the ovaries. COMPARISON:  Prior ultrasound from 08/12/2006. FINDINGS: Uterus Measurements: 15.1 x 10.6 x 13.3 cm = volume: 1122.8 mL. Large probable fibroid within the central aspect of the uterus measures 9.6 x 6.9 x 10.5 cm, obscuring the underlying endometrium. Additional 1.5 x 1.5 x 1.4 cm intramural fibroid at the left uterine fundus. Endometrium Not visualized. Right ovary The native right ovary is not definitely seen. There is a large mildly complex cystic structure measuring 11.1 x 4.9 x 4.2 cm. Lesion is somewhat oblong an tubular morphology, with scattered low-level internal echoes. No appreciable vascularity. Left ovary The native left ovary is not definitely seen. There is a complex cystic structure within the left adnexa measuring 6.4 x 2.6 x 3.8 cm. Unclear whether this reflects a single complex cystic lesion multiple adjacent CIS, and/or possible hydrosalpinx. Evaluation for possible ovarian torsion limited on this examination. Pulsed Doppler interrogation of both adnexa demonstrates normal arterial and venous waveforms,  with no definite evidence for torsion. Other findings No abnormal free fluid. IMPRESSION: 1. Enlarged fibroid uterus with dominant 10.5 cm fibroid within the central aspect of the uterus. 2. Nonvisualization of the endometrium, obscured by the overlying fibroid. 3. Complex cystic lesions within the bilateral adnexa, measuring up to 11.1 cm on the right and 6.4 cm on the left, indeterminate. Gynecologic referral for further workup and consultation recommended. 4. The underlying native ovaries are not well seen on this exam due to the adnexal cystic lesions. No obvious evidence for torsion. Electronically Signed   By: Jeannine Boga M.D.   On: 12/18/2018 00:25   US Pelvis Complete  Result Date: 12/18/2018 CLINICAL DATA:  Initial evaluation for  acute vaginal bleeding. EXAM: TRANSABDOMINAL AND TRANSVAGINAL ULTRASOUND OF PELVIS DOPPLER ULTRASOUND OF OVARIES TECHNIQUE: Both transabdominal and transvaginal ultrasound examinations of the pelvis were performed. Transabdominal technique was performed for global imaging of the pelvis including uterus, ovaries, adnexal regions, and pelvic cul-de-sac. It was necessary to proceed with endovaginal exam following the transabdominal exam to visualize the uterus, endometrium, and ovaries. Color and duplex Doppler ultrasound was utilized to evaluate blood flow to the ovaries. COMPARISON:  Prior ultrasound from 08/12/2006. FINDINGS: Uterus Measurements: 15.1 x 10.6 x 13.3 cm = volume: 1122.8 mL. Large probable fibroid within the central aspect of the uterus measures 9.6 x 6.9 x 10.5 cm, obscuring the underlying endometrium. Additional 1.5 x 1.5 x 1.4 cm intramural fibroid at the left uterine fundus. Endometrium Not visualized. Right ovary The native right ovary is not definitely seen. There is a large mildly complex cystic structure measuring 11.1 x 4.9 x 4.2 cm. Lesion is somewhat oblong an tubular morphology, with scattered low-level internal echoes. No appreciable  vascularity. Left ovary The native left ovary is not definitely seen. There is a complex cystic structure within the left adnexa measuring 6.4 x 2.6 x 3.8 cm. Unclear whether this reflects a single complex cystic lesion multiple adjacent CIS, and/or possible hydrosalpinx. Evaluation for possible ovarian torsion limited on this examination. Pulsed Doppler interrogation of both adnexa demonstrates normal arterial and venous waveforms, with no definite evidence for torsion. Other findings No abnormal free fluid. IMPRESSION: 1. Enlarged fibroid uterus with dominant 10.5 cm fibroid within the central aspect of the uterus. 2. Nonvisualization of the endometrium, obscured by the overlying fibroid. 3. Complex cystic lesions within the bilateral adnexa, measuring up to 11.1 cm on the right and 6.4 cm on the left, indeterminate. Gynecologic referral for further workup and consultation recommended. 4. The underlying native ovaries are not well seen on this exam due to the adnexal cystic lesions. No obvious evidence for torsion. Electronically Signed   By: Jeannine Boga M.D.   On: 12/18/2018 00:25   [place] Art/ven Flow Abd Pelv Doppler  Result Date: 12/18/2018 CLINICAL DATA:  Initial evaluation for acute vaginal bleeding. EXAM: TRANSABDOMINAL AND TRANSVAGINAL ULTRASOUND OF PELVIS DOPPLER ULTRASOUND OF OVARIES TECHNIQUE: Both transabdominal and transvaginal ultrasound examinations of the pelvis were performed. Transabdominal technique was performed for global imaging of the pelvis including uterus, ovaries, adnexal regions, and pelvic cul-de-sac. It was necessary to proceed with endovaginal exam following the transabdominal exam to visualize the uterus, endometrium, and ovaries. Color and duplex Doppler ultrasound was utilized to evaluate blood flow to the ovaries. COMPARISON:  Prior ultrasound from 08/12/2006. FINDINGS: Uterus Measurements: 15.1 x 10.6 x 13.3 cm = volume: 1122.8 mL. Large probable fibroid within the  central aspect of the uterus measures 9.6 x 6.9 x 10.5 cm, obscuring the underlying endometrium. Additional 1.5 x 1.5 x 1.4 cm intramural fibroid at the left uterine fundus. Endometrium Not visualized. Right ovary The native right ovary is not definitely seen. There is a large mildly complex cystic structure measuring 11.1 x 4.9 x 4.2 cm. Lesion is somewhat oblong an tubular morphology, with scattered low-level internal echoes. No appreciable vascularity. Left ovary The native left ovary is not definitely seen. There is a complex cystic structure within the left adnexa measuring 6.4 x 2.6 x 3.8 cm. Unclear whether this reflects a single complex cystic lesion multiple adjacent CIS, and/or possible hydrosalpinx. Evaluation for possible ovarian torsion limited on this examination. Pulsed Doppler interrogation of both adnexa demonstrates normal arterial and venous  waveforms, with no definite evidence for torsion. Other findings No abnormal free fluid. IMPRESSION: 1. Enlarged fibroid uterus with dominant 10.5 cm fibroid within the central aspect of the uterus. 2. Nonvisualization of the endometrium, obscured by the overlying fibroid. 3. Complex cystic lesions within the bilateral adnexa, measuring up to 11.1 cm on the right and 6.4 cm on the left, indeterminate. Gynecologic referral for further workup and consultation recommended. 4. The underlying native ovaries are not well seen on this exam due to the adnexal cystic lesions. No obvious evidence for torsion. Electronically Signed   By: Jeannine Boga M.D.   On: 12/18/2018 00:25    Assessment and plan- Patient is a 48 y.o. female referred for microcytic anemia likely secondary to iron deficiency  Today I will do a complete anemia work-up including CBC with differential, CMP, ferritin and iron studies, B12 and folate, reticulocyte count, TSH, haptoglobin.  Given that she has evidence of microcytosis I suspect she is iron deficient and will need IV iron as her  anemia is significant.  Etiology is likely her heavy menstrual bleeding.  If her iron deficiency anemia does not improve despite correction of her menorrhagia, I will refer her to GI for further work-up.  She does not have medical insurance so any form of IV iron would be okay.  I would recommend 2 doses of Feraheme 510 mg weekly.  Discussed risks and benefits of IV iron including all but not limited to headache, leg swelling and possible risk of infusion reaction.  Patient understands and agrees to proceed as planned.  I will see her back in 2 weeks time to discuss results of her blood work and diameter with 1 of her iron infusions.   Thank you for this kind referral and the opportunity to participate in the care of this patient   Visit Diagnosis 1. Microcytic anemia     Dr. Randa Evens, MD, MPH [facility] [id number] 12/30/2018

## 2018-12-30 NOTE — Progress Notes (Signed)
Ms. Tiffany Rodriguez is in clinic today for hematology consult. We have had a cancellation in the gyn onc schedule 10/7. Offered her an appointment. With such late notice she cannot get off work. She will keep her appointment 10/14.

## 2018-12-31 LAB — HAPTOGLOBIN: Haptoglobin: 177 mg/dL (ref 42–296)

## 2019-01-01 ENCOUNTER — Other Ambulatory Visit: Payer: Self-pay | Admitting: *Deleted

## 2019-01-01 ENCOUNTER — Inpatient Hospital Stay: Payer: Self-pay

## 2019-01-01 ENCOUNTER — Other Ambulatory Visit: Payer: Self-pay

## 2019-01-01 ENCOUNTER — Encounter: Payer: Self-pay | Admitting: Oncology

## 2019-01-01 VITALS — BP 122/75 | HR 81 | Temp 98.8°F | Resp 20

## 2019-01-01 DIAGNOSIS — D509 Iron deficiency anemia, unspecified: Secondary | ICD-10-CM

## 2019-01-01 MED ORDER — SODIUM CHLORIDE 0.9 % IV SOLN
510.0000 mg | Freq: Once | INTRAVENOUS | Status: AC
  Administered 2019-01-01: 510 mg via INTRAVENOUS
  Filled 2019-01-01: qty 17

## 2019-01-01 MED ORDER — SODIUM CHLORIDE 0.9 % IV SOLN: 510.0000 mg | Freq: Once | INTRAVENOUS | 0 refills | Status: DC

## 2019-01-01 MED ORDER — SODIUM CHLORIDE 0.9 % IV SOLN
Freq: Once | INTRAVENOUS | Status: AC
  Administered 2019-01-01: 14:00:00 via INTRAVENOUS
  Filled 2019-01-01: qty 250

## 2019-01-01 NOTE — Progress Notes (Signed)
I will add it to her next feraheme. She came today and I did not know she was on schedule.

## 2019-01-02 ENCOUNTER — Encounter: Payer: Self-pay | Admitting: Pharmacy Technician

## 2019-01-02 NOTE — Progress Notes (Signed)
Patient has been approved for drug assistance by [employer] for Feraheme. The enrollment period is from 01/01/19-12/31/19 based on self pay. First DOS covered is 01/01/2019.

## 2019-01-07 ENCOUNTER — Inpatient Hospital Stay: Payer: Self-pay

## 2019-01-07 ENCOUNTER — Other Ambulatory Visit: Payer: Self-pay

## 2019-01-07 ENCOUNTER — Inpatient Hospital Stay (HOSPITAL_BASED_OUTPATIENT_CLINIC_OR_DEPARTMENT_OTHER): Payer: Self-pay | Admitting: Obstetrics and Gynecology

## 2019-01-07 VITALS — BP 145/85 | HR 84 | Temp 98.4°F | Resp 15 | Wt 189.0 lb

## 2019-01-07 DIAGNOSIS — D252 Subserosal leiomyoma of uterus: Secondary | ICD-10-CM

## 2019-01-07 DIAGNOSIS — N920 Excessive and frequent menstruation with regular cycle: Secondary | ICD-10-CM

## 2019-01-07 DIAGNOSIS — N8003 Adenomyosis of the uterus: Secondary | ICD-10-CM

## 2019-01-07 DIAGNOSIS — N800 Endometriosis of uterus: Secondary | ICD-10-CM

## 2019-01-07 DIAGNOSIS — D259 Leiomyoma of uterus, unspecified: Secondary | ICD-10-CM

## 2019-01-07 DIAGNOSIS — F17200 Nicotine dependence, unspecified, uncomplicated: Secondary | ICD-10-CM | POA: Insufficient documentation

## 2019-01-07 DIAGNOSIS — D251 Intramural leiomyoma of uterus: Secondary | ICD-10-CM

## 2019-01-07 NOTE — Progress Notes (Signed)
Gynecologic Oncology Consult Visit   Referring Provider: Dr. Gilman Schmidt  Chief Complaint: abnormal uterine bleeding   Subjective:  Tiffany Rodriguez is a 48 y.o. G4P2 female who is seen in consultation from Dr. Gilman Schmidt    Patient initially presented to emergency room for heavy vaginal bleeding for about 2 weeks.  Passing large clots and soaking 1 pad per hour.  Symptomatic with weakness, fatigue, dizziness.  Presenting hemoglobin was 5.3 and she received 1 unit of PRBCs in the ER.  She was admitted to the hospital and received second blood transfusion.  Patient reported similar episode approximately 8 years ago and presented to ER at that time with hemoglobin of 3 and underwent a D&C.  No episodes since that time.  Believes last Pap smear was normal.  Denies history of abnormal Pap smears and/or STDs.  Denies unintended weight loss, early satiety, bloating, or constipation.  She smokes cigarettes.  On exam, lower abdominal mass, mobile, nontender, extends along the midline.  Started on Provera 20 mg p.o. 3 times daily for bleeding control.  Most recent hemoglobin was 7.3 on 12/30/2018.  Microcytosis noted.  Ultrasound pelvis- Uterus measures 15.1 x 10.6 x 13.3 cm, large probable fibroid within the central aspect of the uterus measuring 9.6 x 6.9 x 10.5 cm obscuring the endometrium.  Additional 1.5 x 1.5 x 1.4 cm intramural fibroid at the left uterine fundus.  Right ovary-large mildly complex cystic structure measuring 11.1 x 4.9 x 4.2 cm.  Lesion was somewhat oblong with scattered low-level internal echoes.  No appreciable vascularity.  Left ovary-complex cystic structure within the left adnexa measuring 6.4 x 2.6 x 3.8 cm.  Unclear whether this reflects a single complex cystic lesion multiple adjacent CIS, and/for possible hydrosalpinx.   12/18/2018-MRI pelvis with and without contrast 1. Severe diffuse adenomyosis.   2. At least 4 subserosal fibroids measuring up to 3.2 cm 3. Bilateral  hydrosalpinx. Ovaries not confidently identified  CA 125- 152 (elevated) HE4 64.5 (elevated)  Premenopausal ROMA- 1.46 (elevated)- 14.6% (high risk)  12/24/2018- Pap- NILM  A. ENDOMETRIUM, BIOPSY:  - Degenerating secretory type endometrium - There is no evidence of hyperplasia or malignancy   Patient had ultrasound in 2008 for bleeding. Within the uterus was 3.84 cm mass consistent with fibroid, right adnexal cystic structure measured 11.65 cm x 3.64 cm; consistent with hydrosalpinx. Left adnexal cystic structure measures 5.02 cm.   She is very interested in surgery. Dr. Gilman Schmidt is considering a minimally invasive approach vs laparotomy. The patient presents today to discuss robotic approach.   Problem List: Patient Active Problem List   Diagnosis Date Noted  . Smoker 01/07/2019  . Microcytic anemia 12/30/2018  . Menorrhagia with irregular cycle 12/18/2018  . Anemia associated with acute blood loss 12/18/2018  . Fibroid uterus 12/18/2018  . Bilateral ovarian cysts 12/18/2018  . Severe anemia 12/18/2018    Past Medical History: Past Medical History:  Diagnosis Date  . Anemia   . Arthritis    right knee  . Asthma     Past Surgical History: Past Surgical History:  Procedure Laterality Date  . DILATION AND CURETTAGE OF UTERUS  2006  . KNEE ARTHROSCOPY      Past Gynecologic History:  Menarche: age 9 Menstrual details: 3-4 days Menses regular: no Last Menstrual Period:  History of STDs: The patient reports a past history of: gonorrhea.    OB History:  OB History  Gravida Para Term Preterm AB Living  4 2 2   2  2  SAB TAB Ectopic Multiple Live Births    2     2    # Outcome Date GA Lbr Len/2nd Weight Sex Delivery Anes PTL Lv  4 Term 12/26/93   7 lb 5 oz (3.317 kg) M Vag-Spont   LIV  3 Term 02/17/90   9 lb 6 oz (4.252 kg) M Vag-Spont   LIV  2 TAB           1 TAB             Obstetric Comments  2 elective terminations by D&C, one in 1992 and one in 10     Family History: Family History  Problem Relation Age of Onset  . Diabetes Paternal Grandmother   . Leukemia Paternal Uncle   . Leukemia Paternal Grandfather     Social History: Social History   Socioeconomic History  . Marital status: Single    Spouse name: Not on file  . Number of children: Not on file  . Years of education: Not on file  . Highest education level: Not on file  Occupational History  . Not on file  Social Needs  . Financial resource strain: Not on file  . Food insecurity    Worry: Not on file    Inability: Not on file  . Transportation needs    Medical: Not on file    Non-medical: Not on file  Tobacco Use  . Smoking status: Current Every Day Smoker    Packs/day: 1.00    Types: Cigarettes  . Smokeless tobacco: Never Used  Substance and Sexual Activity  . Alcohol use: No  . Drug use: Never  . Sexual activity: Not Currently    Birth control/protection: None  Lifestyle  . Physical activity    Days per week: Not on file    Minutes per session: Not on file  . Stress: Not on file  Relationships  . Social Herbalist on phone: Not on file    Gets together: Not on file    Attends religious service: Not on file    Active member of club or organization: Not on file    Attends meetings of clubs or organizations: Not on file    Relationship status: Not on file  . Intimate partner violence    Fear of current or ex partner: Not on file    Emotionally abused: Not on file    Physically abused: Not on file    Forced sexual activity: Not on file  Other Topics Concern  . Not on file  Social History Narrative  . Not on file    Allergies: No Known Allergies  Current Medications: Current Outpatient Medications  Medication Sig Dispense Refill  . albuterol (PROVENTIL HFA;VENTOLIN HFA) 108 (90 Base) MCG/ACT inhaler Inhale 2 puffs into the lungs every 6 (six) hours as needed for wheezing or shortness of breath. 1 Inhaler 2  . ibuprofen (ADVIL)  200 MG tablet Take 400 mg by mouth every 6 (six) hours as needed.    . medroxyPROGESTERone (PROVERA) 10 MG tablet Take 2 tablets (20 mg total) by mouth every 8 (eight) hours. Take 2 tablets three times a day for 7 days.: 42 Take 2 tablets two times a day for 7 days: 28 Take 2 tablets once a day for 7 days: 14 Take one tablet a day until directed to stop: 30 120 tablet 3   No current facility-administered medications for this visit.     Review of Systems  General: negative for fevers, changes in weight or night sweats Skin: negative for changes in moles or sores or rash Eyes: negative for changes in vision HEENT: negative for change in hearing, voice changes Breasts: negative for breast lumps Pulmonary: negative for dyspnea, orthopnea, productive cough, wheezing Cardiac: negative for palpitations, pain Gastrointestinal: negative for nausea, vomiting, constipation, diarrhea, hematemesis, hematochezia Genitourinary/Sexual: negative for dysuria, retention, hematuria. Positive for occasional incontinence Ob/Gyn:  irregular bleeding, pain Musculoskeletal: negative for pain, joint pain, back pain Hematology: negative for easy bruising, abnormal gyn bleeding Neurologic/Psych: negative for headaches, seizures, paralysis, weakness, numbness    Objective:  Physical Examination:  Today's Vitals   01/07/19 1402  BP: (!) 145/85  Pulse: 84  Resp: 15  Temp: 98.4 F (36.9 C)  SpO2: 100%  Weight: 189 lb (85.7 kg)  PainSc: 0-No pain   Body mass index is 33.48 kg/m. BP (!) 145/85   Pulse 84   Temp 98.4 F (36.9 C)   Resp 15   Wt 189 lb (85.7 kg)   LMP 12/14/2018   SpO2 100%   BMI 33.48 kg/m     ECOG Performance Status: 1 - Symptomatic but completely ambulatory  GENERAL: Patient is a well appearing female in no acute distress HEENT:  PERRL, neck supple with midline trachea. Thyroid without masses.  NODES:  No cervical, supraclavicular, axillary, or inguinal lymphadenopathy palpated.   LUNGS:  Clear to auscultation bilaterally.  No wheezes or rhonchi. HEART:  Regular rate and rhythm. No murmur appreciated. ABDOMEN:  Soft, nontender.  Positive, normoactive bowel sounds. No hepatosplenomegaly or ascites. Midline pelvic mass half way to umbilicus, firm to palpation and mobile.  MSK:  No focal spinal tenderness to palpation. Full range of motion bilaterally in the upper extremities. EXTREMITIES:  No peripheral edema.   SKIN:  Clear with no obvious rashes or skin changes. No nail dyscrasia. NEURO:  Nonfocal. Well oriented.  Appropriate affect.  Pelvic: EGBUS: no lesions Cervix: no lesions, nontender, mobile Vagina: no lesions, no discharge or bleeding Uterus: enalrged 15-16 week size, nontender, mobile, but slightly limited. The mass does not extend to the sidewall and is not deep in the cul-de-sac. Adnexa: no palpable masses but limited by uterine size Rectovaginal: not indicated  Lab Review Labs on site today: Lab Results  Component Value Date   WBC 7.4 12/30/2018   HGB 7.3 (L) 12/30/2018   HCT 26.1 (L) 12/30/2018   MCV 73.9 (L) 12/30/2018   PLT 306 12/30/2018    Status:  Final result Visible to patient:  No (not released) Next appt:  01/08/2019 at 04:10 PM in Obstetrics and Gynecology Homero Fellers, MD) Dx:  Microcytic anemia  Ref Range & Units 8d ago  Iron 28 - 170 ug/dL 11Low    TIBC 250 - 450 ug/dL 458High    Saturation Ratios 10.4 - 31.8 % 2Low    UIBC ug/dL 447          Chemistry      Component Value Date/Time   NA 138 12/30/2018 1615   K 3.6 12/30/2018 1615   CL 108 12/30/2018 1615   CO2 23 12/30/2018 1615   BUN 12 12/30/2018 1615   CREATININE 0.70 12/30/2018 1615      Component Value Date/Time   CALCIUM 9.1 12/30/2018 1615   ALKPHOS 43 12/30/2018 1615   AST 16 12/30/2018 1615   ALT 13 12/30/2018 1615   BILITOT 0.2 (L) 12/30/2018 1615     Lab Results  Component Value Date  TSH 1.490 12/30/2018      Radiologic  Imaging: As per HPI    Assessment:  Tiffany Rodriguez is a 48 y.o. female diagnosed with symptomatic leiomyoma with iron-deficiency anemia, urinary incontinence of uncertain etiology, and bilateral ovarian cysts with elevated ROMA. However, ROMA may be falsely elevated due to high CA125 secondary to leiomyoma.  Medical co-morbidities complicating care: Body mass index is 33.48 kg/m. Tobacco use.  Plan:   Problem List Items Addressed This Visit      Genitourinary   Fibroid uterus    Other Visit Diagnoses    Adenomyosis of uterus    -  Primary      We discussed options for management including robotic minimally invasive surgery vs laparotomy for symptomatic leiomyoma. We reviewed advantages and disadvantages for each approach with regard to length of hospital stay, postoperative recovery, and cosmesis. Reviewed if MIS need for contained manual morcellation within a bag and discussed risks of disseminated maligancy and peritoneal leiomyoma when these procedures were performed with uncontained power morcellation. Risk of malignancy in the setting of leiomyoma is <1%. Risks of surgery were discussed in detail. These include infection, anesthesia, bleeding, transfusion, wound separation, injury to nearby organs (bowel, bladder, blood vessels, ureters), medical issues (blood clots, stroke, heart attack, fluid in the lungs, pneumonia, abnormal heart rhythm, death), possible exploratory surgery with larger incision. I also recommended possible ovarian cystectomy at the time of surgery if she has ovarian cysts versus observation based on intraoperative appearance. She will also review all this issues with Dr. Gilman Schmidt.   Recommended Dr. Gilman Schmidt contact Dr. Janese Banks to determine time needed for anemia to be optimally managed and proceed with surgery.   Given urinary incontinence symptoms consider Urology consult preoperative and also recommend Urology for stent placement to assist with ureteral  identification at the time of surgery.   The patient's diagnosis, an outline of the further diagnostic and laboratory studies which will be required, the recommendation for surgery, and alternatives were discussed with her and her accompanying family members.  All questions were answered to their satisfaction.  A total of 60 minutes were spent with the patient/family today; >50% was spent in education, counseling and coordination of care for symptomatic leiomyoma with iron-deficiency anemia, urinary incontinence of uncertain etiology, and bilateral ovarian cysts with elevated ROMA.  I personally had a face to face interaction and evaluated the patient jointly with the NP, Ms. Beckey Rutter.  I have reviewed her history and available records and have performed the key portions of the physical exam including HEENT, neurologic, abdominal exam, pelvic exam with my findings confirming those documented above by the APP.  I have discussed the case with the APP and the patient.  I agree with the above documentation, assessment and plan which was fully formulated by me.  Counseling was completed by me.   I personally saw the patient and performed a substantive portion of this encounter in conjunction with the listed APP as documented above.   Tiffany Quillin Gaetana Michaelis, MD    CC:  Homero Fellers, MD [place],  [place] [id number] [phone]

## 2019-01-07 NOTE — Discharge Summary (Signed)
Physician Discharge Summary   Patient ID: Tiffany Rodriguez [id number] 48 y.o. 26-Jun-1970  Admit date: 12/17/2018  Discharge date and time: 12/18/2018  4:30 PM   Admitting Physician: Will Bonnet, MD   Discharge Physician: Adrian Prows MD  Admission Diagnoses: Vaginal bleeding [N93.9] Low hemoglobin [D64.9]  Discharge Diagnoses: Abnormal uterine bleeding, Acute on chronic blood loss, adenomyosis  Admission Condition: fair  Discharged Condition: good  Indication for Admission: Acute blood loss anemia  Hospital Course: She was admitted and received IV transfusion of pRBC and IV iron  Consults: None  Significant Diagnostic Studies: see epic  Treatments: see epic  Discharge Exam: BP 127/79 (BP Location: Left Arm)   Pulse 78   Temp 98.8 F (37.1 C) (Oral)   Resp 20   Ht 5\' 3"  (1.6 m)   Wt 86.2 kg   LMP 12/14/2018   SpO2 100% Comment: Room Air  BMI 33.66 kg/m   General Appearance:    Alert, cooperative, no distress, appears stated age  Head:    Normocephalic, without obvious abnormality, atraumatic  Eyes:    PERRL, conjunctiva/corneas clear, EOM's intact, fundi    benign, both eyes  Ears:    Normal TM's and external ear canals, both ears  Nose:   Nares normal, septum midline, mucosa normal, no drainage    or sinus tenderness  Throat:   Lips, mucosa, and tongue normal; teeth and gums normal  Neck:   Supple, symmetrical, trachea midline, no adenopathy;    thyroid:  no enlargement/tenderness/nodules; no carotid   bruit or JVD  Back:     Symmetric, no curvature, ROM normal, no CVA tenderness  Lungs:     Clear to auscultation bilaterally, respirations unlabored  Chest Wall:    No tenderness or deformity   Heart:    Regular rate and rhythm, S1 and S2 normal, no murmur, rub   or gallop  Breast Exam:    No tenderness, masses, or nipple abnormality  Abdomen:     Soft, non-tender, bowel sounds active all four quadrants,    no masses, no organomegaly   Genitalia:    Normal female without lesion, discharge or tenderness  Rectal:    Normal tone, normal prostate, no masses or tenderness;   guaiac negative stool  Extremities:   Extremities normal, atraumatic, no cyanosis or edema  Pulses:   2+ and symmetric all extremities  Skin:   Skin color, texture, turgor normal, no rashes or lesions  Lymph nodes:   Cervical, supraclavicular, and axillary nodes normal  Neurologic:   CNII-XII intact, normal strength, sensation and reflexes    throughout    Disposition:   Patient Instructions:  Allergies as of 12/18/2018   No Known Allergies     Medication List    TAKE these medications   albuterol 108 (90 Base) MCG/ACT inhaler Commonly known as: VENTOLIN HFA Inhale 2 puffs into the lungs every 6 (six) hours as needed for wheezing or shortness of breath.   medroxyPROGESTERone 10 MG tablet Commonly known as: PROVERA Take 2 tablets (20 mg total) by mouth every 8 (eight) hours. Take 2 tablets three times a day for 7 days.: 42 Take 2 tablets two times a day for 7 days: 28 Take 2 tablets once a day for 7 days: 14 Take one tablet a day until directed to stop: 30      Activity: activity as tolerated Diet: regular diet Wound Care: none needed  Follow-up with [facility] in 1 week.  Signed: Christanna R  Schuman 01/07/2019 10:40 PM

## 2019-01-08 ENCOUNTER — Ambulatory Visit: Payer: Self-pay | Admitting: Obstetrics and Gynecology

## 2019-01-09 ENCOUNTER — Inpatient Hospital Stay (HOSPITAL_BASED_OUTPATIENT_CLINIC_OR_DEPARTMENT_OTHER): Payer: Self-pay | Admitting: Oncology

## 2019-01-09 ENCOUNTER — Other Ambulatory Visit: Payer: Self-pay

## 2019-01-09 ENCOUNTER — Encounter: Payer: Self-pay | Admitting: Oncology

## 2019-01-09 ENCOUNTER — Inpatient Hospital Stay: Payer: Self-pay

## 2019-01-09 VITALS — BP 123/77 | HR 75 | Temp 98.2°F | Ht 63.0 in | Wt 189.0 lb

## 2019-01-09 VITALS — BP 118/77 | HR 78 | Resp 18

## 2019-01-09 DIAGNOSIS — D509 Iron deficiency anemia, unspecified: Secondary | ICD-10-CM

## 2019-01-09 DIAGNOSIS — E538 Deficiency of other specified B group vitamins: Secondary | ICD-10-CM

## 2019-01-09 MED ORDER — SODIUM CHLORIDE 0.9 % IV SOLN
Freq: Once | INTRAVENOUS | Status: AC
  Administered 2019-01-09: 12:00:00 via INTRAVENOUS
  Filled 2019-01-09: qty 250

## 2019-01-09 MED ORDER — CYANOCOBALAMIN 1000 MCG/ML IJ SOLN
1000.0000 ug | INTRAMUSCULAR | Status: DC
  Administered 2019-01-09: 1000 ug via INTRAMUSCULAR
  Filled 2019-01-09: qty 1

## 2019-01-09 MED ORDER — SODIUM CHLORIDE 0.9 % IV SOLN
510.0000 mg | Freq: Once | INTRAVENOUS | Status: AC
  Administered 2019-01-09: 510 mg via INTRAVENOUS
  Filled 2019-01-09: qty 17

## 2019-01-09 NOTE — Progress Notes (Signed)
Patient stated that she had been doing well with no complaints. 

## 2019-01-11 NOTE — Progress Notes (Signed)
Hematology/Oncology Consult note [facility]  [phone] [phone]  Patient Care Team: Patient, No Pcp Per as PCP - General (General Practice) Clent Jacks, RN as Oncology Nurse Navigator   Name of the patient: Tiffany Rodriguez  [id number]  [birth year]   Date of visit: 01/11/19  Diagnosis-iron deficiency anemia  Chief complaint/ Reason for visit-discussed the results of blood work  Heme/Onc history: patient is a 48 year old African-American female referred to [place] for anemia.  She has been following up with [facility] Dr. Gilman Schmidt for abnormal uterine bleeding.Most recent hemoglobin from 12/18/2018 showed white count of 6.4, H&H of 7.1/23.4 with an MCV of 71 and a platelet count of 177.  She is also received blood transfusion recently.  Endometrial biopsy showed no evidence of hyperplasia or malignancy.  Patient states she has had  Irregular and heavy menstrual bleeding that has been on and off for many years but particularly worse since 1 year. She is strongly considering hysterectomy at this time. She had dizzy spells when she was admitetd with hb of 5.2 but those symptoms have improved since blood transfusion. She feels fatigued. She still has ongoing menstrual bleeding  Interval history-patient was also seen GYN Onc and plans to undergo laparoscopy hysterectomy after optimizing her anemia.  She reports no significant improvement in her fatigue.  She still has ongoing menstrual bleeding  ECOG PS- 1 Pain scale- 0   Review of systems- Review of Systems  Constitutional: Positive for malaise/fatigue. Negative for chills, fever and weight loss.  HENT: Negative for congestion, ear discharge and nosebleeds.   Eyes: Negative for blurred vision.  Respiratory: Negative for cough, hemoptysis, sputum production, shortness of breath and wheezing.   Cardiovascular: Negative for chest pain, palpitations, orthopnea and claudication.    Gastrointestinal: Negative for abdominal pain, blood in stool, constipation, diarrhea, heartburn, melena, nausea and vomiting.  Genitourinary: Negative for dysuria, flank pain, frequency, hematuria and urgency.  Musculoskeletal: Negative for back pain, joint pain and myalgias.  Skin: Negative for rash.  Neurological: Negative for dizziness, tingling, focal weakness, seizures, weakness and headaches.  Endo/Heme/Allergies: Does not bruise/bleed easily.       Menorrhagia  Psychiatric/Behavioral: Negative for depression and suicidal ideas. The patient does not have insomnia.       No Known Allergies   Past Medical History:  Diagnosis Date   Anemia    Arthritis    right knee   Asthma      Past Surgical History:  Procedure Laterality Date   DILATION AND CURETTAGE OF UTERUS  2006   KNEE ARTHROSCOPY      Social History   Socioeconomic History   Marital status: Single    Spouse name: Not on file   Number of children: Not on file   Years of education: Not on file   Highest education level: Not on file  Occupational History   Not on file  Social Needs   Financial resource strain: Not on file   Food insecurity    Worry: Not on file    Inability: Not on file   Transportation needs    Medical: Not on file    Non-medical: Not on file  Tobacco Use   Smoking status: Current Every Day Smoker    Packs/day: 1.00    Types: Cigarettes   Smokeless tobacco: Never Used  Substance and Sexual Activity   Alcohol use: No   Drug use: Never   Sexual activity: Not Currently    Birth  control/protection: None  Lifestyle   Physical activity    Days per week: Not on file    Minutes per session: Not on file   Stress: Not on file  Relationships   Social connections    Talks on phone: Not on file    Gets together: Not on file    Attends religious service: Not on file    Active member of club or organization: Not on file    Attends meetings of clubs or  organizations: Not on file    Relationship status: Not on file   Intimate partner violence    Fear of current or ex partner: Not on file    Emotionally abused: Not on file    Physically abused: Not on file    Forced sexual activity: Not on file  Other Topics Concern   Not on file  Social History Narrative   Not on file    Family History  Problem Relation Age of Onset   Diabetes Paternal Grandmother    Leukemia Paternal Uncle    Leukemia Paternal Grandfather      Current Outpatient Medications:    albuterol (PROVENTIL HFA;VENTOLIN HFA) 108 (90 Base) MCG/ACT inhaler, Inhale 2 puffs into the lungs every 6 (six) hours as needed for wheezing or shortness of breath., Disp: 1 Inhaler, Rfl: 2   ibuprofen (ADVIL) 200 MG tablet, Take 400 mg by mouth every 6 (six) hours as needed., Disp: , Rfl:    medroxyPROGESTERone (PROVERA) 10 MG tablet, Take 2 tablets (20 mg total) by mouth every 8 (eight) hours. Take 2 tablets three times a day for 7 days.: 42 Take 2 tablets two times a day for 7 days: 28 Take 2 tablets once a day for 7 days: 14 Take one tablet a day until directed to stop: 30, Disp: 120 tablet, Rfl: 3  Physical exam:  Vitals:   01/09/19 1124  BP: 123/77  Pulse: 75  Temp: 98.2 F (36.8 C)  TempSrc: Tympanic  Weight: 189 lb (85.7 kg)  Height: 5\' 3"  (1.6 m)   Physical Exam HENT:     Head: Normocephalic and atraumatic.  Eyes:     Pupils: Pupils are equal, round, and reactive to light.  Neck:     Musculoskeletal: Normal range of motion.  Cardiovascular:     Rate and Rhythm: Normal rate and regular rhythm.     Heart sounds: Normal heart sounds.  Pulmonary:     Effort: Pulmonary effort is normal.     Breath sounds: Normal breath sounds.  Abdominal:     General: Bowel sounds are normal.     Palpations: Abdomen is soft.  Skin:    General: Skin is warm and dry.  Neurological:     Mental Status: She is alert and oriented to person, place, and time.      CMP Latest  Ref Rng & Units 12/30/2018  Glucose 70 - 99 mg/dL 88  BUN 6 - 20 mg/dL 12  Creatinine 0.44 - 1.00 mg/dL 0.70  Sodium 135 - 145 mmol/L 138  Potassium 3.5 - 5.1 mmol/L 3.6  Chloride 98 - 111 mmol/L 108  CO2 22 - 32 mmol/L 23  Calcium 8.9 - 10.3 mg/dL 9.1  Total Protein 6.5 - 8.1 g/dL 6.4(L)  Total Bilirubin 0.3 - 1.2 mg/dL 0.2(L)  Alkaline Phos 38 - 126 U/L 43  AST 15 - 41 U/L 16  ALT 0 - 44 U/L 13   CBC Latest Ref Rng & Units 12/30/2018  WBC  4.0 - 10.5 K/uL 7.4  Hemoglobin 12.0 - 15.0 g/dL 7.3(L)  Hematocrit 36.0 - 46.0 % 26.1(L)  Platelets 150 - 400 K/uL 306    No images are attached to the encounter.  Mr Pelvis W Wo Contrast  Result Date: 12/18/2018 CLINICAL DATA:  Evaluate pelvic mass. EXAM: MRI PELVIS WITHOUT AND WITH CONTRAST TECHNIQUE: Multiplanar multisequence MR imaging of the pelvis was performed both before and after administration of intravenous contrast. CONTRAST:  57mL GADAVIST GADOBUTROL 1 MMOL/ML IV SOLN COMPARISON:  Ultrasound 12/17/18 FINDINGS: Urinary Tract:  No abnormality visualized. Bowel:  Unremarkable visualized pelvic bowel loops. Vascular/Lymphatic: No pathologically enlarged lymph nodes. No significant vascular abnormality seen. Reproductive: Uterus: 16.4 by 13.8 by 11.4 cm (volume = 1350 cm^3). The uterus is diffusely enlarged. Marked thickening of the junctional zone is noted. Posteriorly this measures 6.2 cm in thickness. Anteriorly this measures 2.7 cm, image 17/5. Throughout the diffusely thickened junctional zone are multiple small T2 hyperintense foci reflecting small cystic areas. Findings are most consistent with diffuse adenomyosis. There are several well-circumscribed, ovoid subserosal lesions identified along the posterior, anterior and bilateral myometrium. These appear T1 hypointense, mildly T2 hyperintense and shows uniform enhancement following contrast administration. Consistent with subserosal fibroids. The largest of these is along the anterior  myometrium measuring 3.2 cm in maximum dimension. Right ovary: Not confidently identified. Marked right sided hydrosalpinx is identified with a maximum diameter of 3.6 cm. Left ovary: Not confidently identified. Left hydrosalpinx is also present measuring up to 1.9 cm, image 22/3. Other:  No free fluid or fluid collections Musculoskeletal: No suspicious bone lesions identified. IMPRESSION: 1. Severe, diffuse adenomyosis. 2. At least 4 subserosal fibroids are identified measuring up to 3.2 cm. 3. Bilateral hydrosalpinx.  Ovaries not confidently identified. Electronically Signed   By: Kerby Moors M.D.   On: 12/18/2018 11:21   US Transvaginal Non-ob  Result Date: 12/18/2018 CLINICAL DATA:  Initial evaluation for acute vaginal bleeding. EXAM: TRANSABDOMINAL AND TRANSVAGINAL ULTRASOUND OF PELVIS DOPPLER ULTRASOUND OF OVARIES TECHNIQUE: Both transabdominal and transvaginal ultrasound examinations of the pelvis were performed. Transabdominal technique was performed for global imaging of the pelvis including uterus, ovaries, adnexal regions, and pelvic cul-de-sac. It was necessary to proceed with endovaginal exam following the transabdominal exam to visualize the uterus, endometrium, and ovaries. Color and duplex Doppler ultrasound was utilized to evaluate blood flow to the ovaries. COMPARISON:  Prior ultrasound from 08/12/2006. FINDINGS: Uterus Measurements: 15.1 x 10.6 x 13.3 cm = volume: 1122.8 mL. Large probable fibroid within the central aspect of the uterus measures 9.6 x 6.9 x 10.5 cm, obscuring the underlying endometrium. Additional 1.5 x 1.5 x 1.4 cm intramural fibroid at the left uterine fundus. Endometrium Not visualized. Right ovary The native right ovary is not definitely seen. There is a large mildly complex cystic structure measuring 11.1 x 4.9 x 4.2 cm. Lesion is somewhat oblong an tubular morphology, with scattered low-level internal echoes. No appreciable vascularity. Left ovary The native left  ovary is not definitely seen. There is a complex cystic structure within the left adnexa measuring 6.4 x 2.6 x 3.8 cm. Unclear whether this reflects a single complex cystic lesion multiple adjacent CIS, and/or possible hydrosalpinx. Evaluation for possible ovarian torsion limited on this examination. Pulsed Doppler interrogation of both adnexa demonstrates normal arterial and venous waveforms, with no definite evidence for torsion. Other findings No abnormal free fluid. IMPRESSION: 1. Enlarged fibroid uterus with dominant 10.5 cm fibroid within the central aspect of the uterus. 2. Nonvisualization of  the endometrium, obscured by the overlying fibroid. 3. Complex cystic lesions within the bilateral adnexa, measuring up to 11.1 cm on the right and 6.4 cm on the left, indeterminate. Gynecologic referral for further workup and consultation recommended. 4. The underlying native ovaries are not well seen on this exam due to the adnexal cystic lesions. No obvious evidence for torsion. Electronically Signed   By: Jeannine Boga M.D.   On: 12/18/2018 00:25   US Pelvis Complete  Result Date: 12/18/2018 CLINICAL DATA:  Initial evaluation for acute vaginal bleeding. EXAM: TRANSABDOMINAL AND TRANSVAGINAL ULTRASOUND OF PELVIS DOPPLER ULTRASOUND OF OVARIES TECHNIQUE: Both transabdominal and transvaginal ultrasound examinations of the pelvis were performed. Transabdominal technique was performed for global imaging of the pelvis including uterus, ovaries, adnexal regions, and pelvic cul-de-sac. It was necessary to proceed with endovaginal exam following the transabdominal exam to visualize the uterus, endometrium, and ovaries. Color and duplex Doppler ultrasound was utilized to evaluate blood flow to the ovaries. COMPARISON:  Prior ultrasound from 08/12/2006. FINDINGS: Uterus Measurements: 15.1 x 10.6 x 13.3 cm = volume: 1122.8 mL. Large probable fibroid within the central aspect of the uterus measures 9.6 x 6.9 x 10.5 cm,  obscuring the underlying endometrium. Additional 1.5 x 1.5 x 1.4 cm intramural fibroid at the left uterine fundus. Endometrium Not visualized. Right ovary The native right ovary is not definitely seen. There is a large mildly complex cystic structure measuring 11.1 x 4.9 x 4.2 cm. Lesion is somewhat oblong an tubular morphology, with scattered low-level internal echoes. No appreciable vascularity. Left ovary The native left ovary is not definitely seen. There is a complex cystic structure within the left adnexa measuring 6.4 x 2.6 x 3.8 cm. Unclear whether this reflects a single complex cystic lesion multiple adjacent CIS, and/or possible hydrosalpinx. Evaluation for possible ovarian torsion limited on this examination. Pulsed Doppler interrogation of both adnexa demonstrates normal arterial and venous waveforms, with no definite evidence for torsion. Other findings No abnormal free fluid. IMPRESSION: 1. Enlarged fibroid uterus with dominant 10.5 cm fibroid within the central aspect of the uterus. 2. Nonvisualization of the endometrium, obscured by the overlying fibroid. 3. Complex cystic lesions within the bilateral adnexa, measuring up to 11.1 cm on the right and 6.4 cm on the left, indeterminate. Gynecologic referral for further workup and consultation recommended. 4. The underlying native ovaries are not well seen on this exam due to the adnexal cystic lesions. No obvious evidence for torsion. Electronically Signed   By: Jeannine Boga M.D.   On: 12/18/2018 00:25   [place] Art/ven Flow Abd Pelv Doppler  Result Date: 12/18/2018 CLINICAL DATA:  Initial evaluation for acute vaginal bleeding. EXAM: TRANSABDOMINAL AND TRANSVAGINAL ULTRASOUND OF PELVIS DOPPLER ULTRASOUND OF OVARIES TECHNIQUE: Both transabdominal and transvaginal ultrasound examinations of the pelvis were performed. Transabdominal technique was performed for global imaging of the pelvis including uterus, ovaries, adnexal regions, and pelvic  cul-de-sac. It was necessary to proceed with endovaginal exam following the transabdominal exam to visualize the uterus, endometrium, and ovaries. Color and duplex Doppler ultrasound was utilized to evaluate blood flow to the ovaries. COMPARISON:  Prior ultrasound from 08/12/2006. FINDINGS: Uterus Measurements: 15.1 x 10.6 x 13.3 cm = volume: 1122.8 mL. Large probable fibroid within the central aspect of the uterus measures 9.6 x 6.9 x 10.5 cm, obscuring the underlying endometrium. Additional 1.5 x 1.5 x 1.4 cm intramural fibroid at the left uterine fundus. Endometrium Not visualized. Right ovary The native right ovary is not definitely seen. There is  a large mildly complex cystic structure measuring 11.1 x 4.9 x 4.2 cm. Lesion is somewhat oblong an tubular morphology, with scattered low-level internal echoes. No appreciable vascularity. Left ovary The native left ovary is not definitely seen. There is a complex cystic structure within the left adnexa measuring 6.4 x 2.6 x 3.8 cm. Unclear whether this reflects a single complex cystic lesion multiple adjacent CIS, and/or possible hydrosalpinx. Evaluation for possible ovarian torsion limited on this examination. Pulsed Doppler interrogation of both adnexa demonstrates normal arterial and venous waveforms, with no definite evidence for torsion. Other findings No abnormal free fluid. IMPRESSION: 1. Enlarged fibroid uterus with dominant 10.5 cm fibroid within the central aspect of the uterus. 2. Nonvisualization of the endometrium, obscured by the overlying fibroid. 3. Complex cystic lesions within the bilateral adnexa, measuring up to 11.1 cm on the right and 6.4 cm on the left, indeterminate. Gynecologic referral for further workup and consultation recommended. 4. The underlying native ovaries are not well seen on this exam due to the adnexal cystic lesions. No obvious evidence for torsion. Electronically Signed   By: Jeannine Boga M.D.   On: 12/18/2018 00:25      Assessment and plan- Patient is a 48 y.o. female with iron and B12 deficiency anemia here for routine follow-up  I discussed the results of anemia work-up with the patient which is consistent with iron deficiency as well as evidence of B12 deficiency.  Patient has received 1 dose of Feraheme so far.  She will receive her second dose of Feraheme today and given her ongoing menstrual bleeding I will plan to give her a total dose as well.  Patient also has evidence of B12 deficiency and will receive a B12 shot today as well as start taking oral B12 1000 mcg p.o. daily.  We will need to wait for 6 to 8 weeks after IV iron infusion to see the improvement in her anemia and iron studies and decide if she needs more IV iron at that time.  If patient needs to go for hysterectomy prior to that she may need a blood transfusion prior to surgery.  I will check an interim CBC in 3 weeks   Visit Diagnosis 1. Iron deficiency anemia, unspecified iron deficiency anemia type   2. B12 deficiency      Dr. Randa Evens, MD, MPH [facility] at [facility] [id number] 01/11/2019 6:49 PM

## 2019-01-14 ENCOUNTER — Other Ambulatory Visit: Payer: Self-pay

## 2019-01-15 ENCOUNTER — Other Ambulatory Visit: Payer: Self-pay

## 2019-01-15 ENCOUNTER — Other Ambulatory Visit: Payer: Self-pay | Admitting: *Deleted

## 2019-01-15 ENCOUNTER — Inpatient Hospital Stay: Payer: Self-pay

## 2019-01-15 VITALS — BP 126/77 | HR 70 | Temp 98.1°F | Resp 18

## 2019-01-15 DIAGNOSIS — D509 Iron deficiency anemia, unspecified: Secondary | ICD-10-CM

## 2019-01-15 MED ORDER — CYANOCOBALAMIN 1000 MCG/ML IJ SOLN
1000.0000 ug | INTRAMUSCULAR | Status: DC
  Administered 2019-01-15: 1000 ug via INTRAMUSCULAR
  Filled 2019-01-15: qty 1

## 2019-01-15 MED ORDER — SODIUM CHLORIDE 0.9 % IV SOLN: 510.0000 mg | Freq: Once | INTRAVENOUS | 0 refills | Status: AC

## 2019-01-15 MED ORDER — SODIUM CHLORIDE 0.9 % IV SOLN
510.0000 mg | Freq: Once | INTRAVENOUS | Status: AC
  Administered 2019-01-15: 15:00:00 510 mg via INTRAVENOUS
  Filled 2019-01-15: qty 17

## 2019-01-15 MED ORDER — SODIUM CHLORIDE 0.9 % IV SOLN
Freq: Once | INTRAVENOUS | Status: AC
  Administered 2019-01-15: 15:00:00 via INTRAVENOUS
  Filled 2019-01-15: qty 250

## 2019-01-15 NOTE — Progress Notes (Signed)
Pt tolerated infusion well. Pt declines staying for full 30 minute post observation. Pt and VS stable at discharge.

## 2019-01-23 ENCOUNTER — Other Ambulatory Visit: Payer: Self-pay

## 2019-01-23 ENCOUNTER — Ambulatory Visit: Payer: Self-pay | Admitting: Obstetrics and Gynecology

## 2019-01-23 ENCOUNTER — Encounter: Payer: Self-pay | Admitting: Obstetrics and Gynecology

## 2019-01-23 VITALS — BP 120/80 | HR 67 | Ht 63.0 in | Wt 187.0 lb

## 2019-01-23 DIAGNOSIS — N393 Stress incontinence (female) (male): Secondary | ICD-10-CM

## 2019-01-23 NOTE — Progress Notes (Signed)
Patient ID: Tiffany Rodriguez, female   DOB: 02-09-1971, 48 y.o.   MRN: [id number]  Reason for Consult: Follow-up   Referred by No ref. provider found  Subjective:     HPI:  Tiffany Rodriguez is a 48 y.o. female  She is following up for menorrhagia and abnormal uterine bleeding. She reports that she has been having some vaginal bleeding since her last visit and that when she followed up with hematology her hemoglobin level had not as high as was expected after her iron transfusions. She has another visit scheduled in a few weeks. The last 1-2 weeks her bleeding has improved and she is on a better schedule of taking her progesterone at the same time each day.    Past Medical History:  Diagnosis Date  . Anemia   . Arthritis    right knee  . Asthma    Family History  Problem Relation Age of Onset  . Diabetes Paternal Grandmother   . Leukemia Paternal Uncle   . Leukemia Paternal Grandfather    Past Surgical History:  Procedure Laterality Date  . DILATION AND CURETTAGE OF UTERUS  2006  . KNEE ARTHROSCOPY      Short Social History:  Social History   Tobacco Use  . Smoking status: Current Every Day Smoker    Packs/day: 1.00    Types: Cigarettes  . Smokeless tobacco: Never Used  Substance Use Topics  . Alcohol use: No    No Known Allergies  Current Outpatient Medications  Medication Sig Dispense Refill  . albuterol (PROVENTIL HFA;VENTOLIN HFA) 108 (90 Base) MCG/ACT inhaler Inhale 2 puffs into the lungs every 6 (six) hours as needed for wheezing or shortness of breath. 1 Inhaler 2  . ibuprofen (ADVIL) 200 MG tablet Take 400 mg by mouth every 6 (six) hours as needed.    . medroxyPROGESTERone (PROVERA) 10 MG tablet Take 2 tablets (20 mg total) by mouth every 8 (eight) hours. Take 2 tablets three times a day for 7 days.: 42 Take 2 tablets two times a day for 7 days: 28 Take 2 tablets once a day for 7 days: 14 Take one tablet a day until directed to stop: 30 120 tablet 3    No current facility-administered medications for this visit.     Review of Systems  Constitutional: Negative for chills, fatigue, fever and unexpected weight change.  HENT: Negative for trouble swallowing.  Eyes: Negative for loss of vision.  Respiratory: Negative for cough, shortness of breath and wheezing.  Cardiovascular: Negative for chest pain, leg swelling, palpitations and syncope.  GI: Negative for abdominal pain, blood in stool, diarrhea, nausea and vomiting.  GU: Negative for difficulty urinating, dysuria, frequency and hematuria.  Musculoskeletal: Negative for back pain, leg pain and joint pain.  Skin: Negative for rash.  Neurological: Negative for dizziness, headaches, light-headedness, numbness and seizures.  Psychiatric: Negative for behavioral problem, confusion, depressed mood and sleep disturbance.        Objective:  Objective   Vitals:   01/23/19 1419  BP: 120/80  Pulse: 67  Weight: 187 lb (84.8 kg)  Height: 5\' 3"  (1.6 m)   Body mass index is 33.13 kg/m.  Physical Exam Vitals signs and nursing note reviewed.  Constitutional:      Appearance: She is well-developed.  HENT:     Head: Normocephalic and atraumatic.  Eyes:     Pupils: Pupils are equal, round, and reactive to light.  Cardiovascular:     Rate and Rhythm: Normal  rate and regular rhythm.  Pulmonary:     Effort: Pulmonary effort is normal. No respiratory distress.  Skin:    General: Skin is warm and dry.  Neurological:     Mental Status: She is alert and oriented to person, place, and time.  Psychiatric:        Behavior: Behavior normal.        Thought Content: Thought content normal.        Judgment: Judgment normal.         Assessment/Plan:      48 yo with enlarge uterus secondary to fibroids and adenomyosis having chronic lood loss anemia secondary to menorrhagia and irregular bleeding.  Continue with daily progesterone therapy. Patient declines course with Lupron for menstural  suppression prior to surgery.   Will refer to urology for evaluation of incontinence, reports a spontaneous leakage of urine occasionally. Will also need their assistance with surgery and placement or ureteral stents  Discussed the surgical approach options of robotic hysterectomy versus laparotomy. She understands that with a robotic hysterectomy she would need an abdominal incision to remove the uterus and fallopian tubes after bag morcellation. She understands that her surgery may be technically challenging given the large size of her uterus. Conversion to laparotomy is possible. The decision to remove or retain her ovaries will have to be based on surgical feasibility. If possible her ovaries will be retained. If her ovaries are removed she would require hormone replacement therapy as she is premenopausal.   Once her hemoglobin has improved will plan for hysterectomy, possibly in December of 2020. Will follow up in 4-6 weeks.   More than 40 minutes were spent face to face with the patient in the room with more than 50% of the time spent providing counseling and discussing the plan of management.    Adrian Prows MD [facility] 01/23/2019 3:08 PM

## 2019-01-30 ENCOUNTER — Other Ambulatory Visit: Payer: Self-pay

## 2019-01-30 ENCOUNTER — Inpatient Hospital Stay: Payer: Self-pay

## 2019-01-30 ENCOUNTER — Inpatient Hospital Stay: Payer: Self-pay | Attending: Oncology

## 2019-01-30 DIAGNOSIS — D509 Iron deficiency anemia, unspecified: Secondary | ICD-10-CM

## 2019-01-30 DIAGNOSIS — Z793 Long term (current) use of hormonal contraceptives: Secondary | ICD-10-CM | POA: Insufficient documentation

## 2019-01-30 DIAGNOSIS — F17210 Nicotine dependence, cigarettes, uncomplicated: Secondary | ICD-10-CM | POA: Insufficient documentation

## 2019-01-30 DIAGNOSIS — Z791 Long term (current) use of non-steroidal anti-inflammatories (NSAID): Secondary | ICD-10-CM | POA: Insufficient documentation

## 2019-01-30 DIAGNOSIS — R5383 Other fatigue: Secondary | ICD-10-CM | POA: Insufficient documentation

## 2019-01-30 DIAGNOSIS — Z79899 Other long term (current) drug therapy: Secondary | ICD-10-CM | POA: Insufficient documentation

## 2019-01-30 DIAGNOSIS — J45909 Unspecified asthma, uncomplicated: Secondary | ICD-10-CM | POA: Insufficient documentation

## 2019-01-30 DIAGNOSIS — D500 Iron deficiency anemia secondary to blood loss (chronic): Secondary | ICD-10-CM | POA: Insufficient documentation

## 2019-01-30 LAB — CBC
HCT: 38.8 % (ref 36.0–46.0)
Hemoglobin: 11 g/dL — ABNORMAL LOW (ref 12.0–15.0)
MCH: 23.7 pg — ABNORMAL LOW (ref 26.0–34.0)
MCHC: 28.4 g/dL — ABNORMAL LOW (ref 30.0–36.0)
MCV: 83.4 fL (ref 80.0–100.0)
Platelets: 276 10*3/uL (ref 150–400)
RBC: 4.65 MIL/uL (ref 3.87–5.11)
RDW: 22.1 % — ABNORMAL HIGH (ref 11.5–15.5)
WBC: 3.8 10*3/uL — ABNORMAL LOW (ref 4.0–10.5)
nRBC: 0 % (ref 0.0–0.2)

## 2019-02-05 ENCOUNTER — Other Ambulatory Visit: Payer: Self-pay | Admitting: *Deleted

## 2019-02-05 MED ORDER — FERUMOXYTOL INJECTION 510 MG/17 ML: 510.0000 mg | INTRAVENOUS | 0 refills | Status: DC

## 2019-02-23 ENCOUNTER — Inpatient Hospital Stay: Payer: Self-pay | Admitting: Oncology

## 2019-02-23 ENCOUNTER — Inpatient Hospital Stay: Payer: Self-pay

## 2019-02-23 ENCOUNTER — Inpatient Hospital Stay (HOSPITAL_BASED_OUTPATIENT_CLINIC_OR_DEPARTMENT_OTHER): Payer: Self-pay | Admitting: Oncology

## 2019-02-23 ENCOUNTER — Other Ambulatory Visit: Payer: Self-pay

## 2019-02-23 ENCOUNTER — Encounter: Payer: Self-pay | Admitting: Oncology

## 2019-02-23 DIAGNOSIS — Z79899 Other long term (current) drug therapy: Secondary | ICD-10-CM

## 2019-02-23 DIAGNOSIS — R5383 Other fatigue: Secondary | ICD-10-CM

## 2019-02-23 DIAGNOSIS — F17210 Nicotine dependence, cigarettes, uncomplicated: Secondary | ICD-10-CM

## 2019-02-23 DIAGNOSIS — E538 Deficiency of other specified B group vitamins: Secondary | ICD-10-CM

## 2019-02-23 DIAGNOSIS — D509 Iron deficiency anemia, unspecified: Secondary | ICD-10-CM

## 2019-02-23 DIAGNOSIS — N920 Excessive and frequent menstruation with regular cycle: Secondary | ICD-10-CM

## 2019-02-23 LAB — CBC WITH DIFFERENTIAL/PLATELET
Abs Immature Granulocytes: 0.02 10*3/uL (ref 0.00–0.07)
Basophils Absolute: 0 10*3/uL (ref 0.0–0.1)
Basophils Relative: 1 %
Eosinophils Absolute: 0.2 10*3/uL (ref 0.0–0.5)
Eosinophils Relative: 3 %
HCT: 31.8 % — ABNORMAL LOW (ref 36.0–46.0)
Hemoglobin: 8.8 g/dL — ABNORMAL LOW (ref 12.0–15.0)
Immature Granulocytes: 0 %
Lymphocytes Relative: 27 %
Lymphs Abs: 1.5 10*3/uL (ref 0.7–4.0)
MCH: 22.6 pg — ABNORMAL LOW (ref 26.0–34.0)
MCHC: 27.7 g/dL — ABNORMAL LOW (ref 30.0–36.0)
MCV: 81.7 fL (ref 80.0–100.0)
Monocytes Absolute: 0.4 10*3/uL (ref 0.1–1.0)
Monocytes Relative: 7 %
Neutro Abs: 3.5 10*3/uL (ref 1.7–7.7)
Neutrophils Relative %: 62 %
Platelets: 307 10*3/uL (ref 150–400)
RBC: 3.89 MIL/uL (ref 3.87–5.11)
RDW: 18.6 % — ABNORMAL HIGH (ref 11.5–15.5)
WBC: 5.5 10*3/uL (ref 4.0–10.5)
nRBC: 0 % (ref 0.0–0.2)

## 2019-02-23 LAB — FERRITIN: Ferritin: 6 ng/mL — ABNORMAL LOW (ref 11–307)

## 2019-02-23 LAB — IRON AND TIBC
Iron: 17 ug/dL — ABNORMAL LOW (ref 28–170)
Saturation Ratios: 4 % — ABNORMAL LOW (ref 10.4–31.8)
TIBC: 443 ug/dL (ref 250–450)
UIBC: 426 ug/dL

## 2019-02-23 LAB — VITAMIN B12: Vitamin B-12: 275 pg/mL (ref 180–914)

## 2019-02-23 NOTE — Progress Notes (Signed)
Patient stated that she had been doing well with no concerns. 

## 2019-02-25 NOTE — Progress Notes (Signed)
I connected with Tiffany Rodriguez on 02/25/19 at  3:15 PM EST by video enabled telemedicine visit and verified that I am speaking with the correct person using two identifiers.   I discussed the limitations, risks, security and privacy concerns of performing an evaluation and management service by telemedicine and the availability of in-person appointments. I also discussed with the patient that there may be a patient responsible charge related to this service. The patient expressed understanding and agreed to proceed.  Other persons participating in the visit and their role in the encounter:  none  Patient's location:  home Provider's location:  work  Risk analyst Complaint:  Routine f/u of iron deficiency anemia  History of present illness: patient is a 48 year old African-American female referred to [place] for anemia. She has been following up with [facility] Dr. Gilman Schmidt for abnormal uterine bleeding.Most recent hemoglobin from 12/18/2018 showed white count of 6.4, H&H of 7.1/23.4 with an MCV of 71 and a platelet count of 177. She is also received blood transfusion recently.Endometrial biopsy showed no evidence of hyperplasia or malignancy.  Patient states she has had Irregular and heavy menstrual bleeding that has been on and off for many years but particularly worse since 1 year. She is awaiting to undergo hysterectomy.  She had dizzy spells when she was admitetd with hb of 5.2 but those symptoms have improved since blood transfusion. She feels fatigued. She still has ongoing menstrual bleeding   Interval history: Patient's hemoglobin improved to 11 after receiving IV iron but then she had another bout of menstrual bleeding that was significantly heavy.  Patient reports mild fatigue but denies other complaints   Review of Systems  Constitutional: Positive for malaise/fatigue. Negative for chills, fever and weight loss.  HENT: Negative for congestion, ear discharge and nosebleeds.   Eyes:  Negative for blurred vision.  Respiratory: Negative for cough, hemoptysis, sputum production, shortness of breath and wheezing.   Cardiovascular: Negative for chest pain, palpitations, orthopnea and claudication.  Gastrointestinal: Negative for abdominal pain, blood in stool, constipation, diarrhea, heartburn, melena, nausea and vomiting.  Genitourinary: Negative for dysuria, flank pain, frequency, hematuria and urgency.       Heavy menstrual bleeding  Musculoskeletal: Negative for back pain, joint pain and myalgias.  Skin: Negative for rash.  Neurological: Negative for dizziness, tingling, focal weakness, seizures, weakness and headaches.  Endo/Heme/Allergies: Does not bruise/bleed easily.  Psychiatric/Behavioral: Negative for depression and suicidal ideas. The patient does not have insomnia.     No Known Allergies  Past Medical History:  Diagnosis Date  . Anemia   . Arthritis    right knee  . Asthma     Past Surgical History:  Procedure Laterality Date  . DILATION AND CURETTAGE OF UTERUS  2006  . KNEE ARTHROSCOPY      Social History   Socioeconomic History  . Marital status: Single    Spouse name: Not on file  . Number of children: Not on file  . Years of education: Not on file  . Highest education level: Not on file  Occupational History  . Not on file  Social Needs  . Financial resource strain: Not on file  . Food insecurity    Worry: Not on file    Inability: Not on file  . Transportation needs    Medical: Not on file    Non-medical: Not on file  Tobacco Use  . Smoking status: Current Every Day Smoker    Packs/day: 1.00    Types: Cigarettes  .  Smokeless tobacco: Never Used  Substance and Sexual Activity  . Alcohol use: No  . Drug use: Never  . Sexual activity: Not Currently    Birth control/protection: None  Lifestyle  . Physical activity    Days per week: Not on file    Minutes per session: Not on file  . Stress: Not on file  Relationships  .  Social Herbalist on phone: Not on file    Gets together: Not on file    Attends religious service: Not on file    Active member of club or organization: Not on file    Attends meetings of clubs or organizations: Not on file    Relationship status: Not on file  . Intimate partner violence    Fear of current or ex partner: Not on file    Emotionally abused: Not on file    Physically abused: Not on file    Forced sexual activity: Not on file  Other Topics Concern  . Not on file  Social History Narrative  . Not on file    Family History  Problem Relation Age of Onset  . Diabetes Paternal Grandmother   . Leukemia Paternal Uncle   . Leukemia Paternal Grandfather      Current Outpatient Medications:  .  albuterol (PROVENTIL HFA;VENTOLIN HFA) 108 (90 Base) MCG/ACT inhaler, Inhale 2 puffs into the lungs every 6 (six) hours as needed for wheezing or shortness of breath., Disp: 1 Inhaler, Rfl: 2 .  ferumoxytol (FERAHEME) 510 MG/17ML SOLN injection, Inject 17 mLs (510 mg total) into the vein as directed. Infuse 510mg  Feraheme IV over at least 15 minutes at day 0 and repeat 3 to 8 days later. Dilute full contents of vial (5ml) per product insert instructions before use. 2 vials(42ml), Disp: 17 mL, Rfl: 0 .  ibuprofen (ADVIL) 200 MG tablet, Take 400 mg by mouth every 6 (six) hours as needed., Disp: , Rfl:  .  medroxyPROGESTERone (PROVERA) 10 MG tablet, Take 2 tablets (20 mg total) by mouth every 8 (eight) hours. Take 2 tablets three times a day for 7 days.: 42 Take 2 tablets two times a day for 7 days: 28 Take 2 tablets once a day for 7 days: 14 Take one tablet a day until directed to stop: 30, Disp: 120 tablet, Rfl: 3  No results found.  No images are attached to the encounter.   CMP Latest Ref Rng & Units 12/30/2018  Glucose 70 - 99 mg/dL 88  BUN 6 - 20 mg/dL 12  Creatinine 0.44 - 1.00 mg/dL 0.70  Sodium 135 - 145 mmol/L 138  Potassium 3.5 - 5.1 mmol/L 3.6  Chloride 98 - 111  mmol/L 108  CO2 22 - 32 mmol/L 23  Calcium 8.9 - 10.3 mg/dL 9.1  Total Protein 6.5 - 8.1 g/dL 6.4(L)  Total Bilirubin 0.3 - 1.2 mg/dL 0.2(L)  Alkaline Phos 38 - 126 U/L 43  AST 15 - 41 U/L 16  ALT 0 - 44 U/L 13   CBC Latest Ref Rng & Units 02/23/2019  WBC 4.0 - 10.5 K/uL 5.5  Hemoglobin 12.0 - 15.0 g/dL 8.8(L)  Hematocrit 36.0 - 46.0 % 31.8(L)  Platelets 150 - 400 K/uL 307     Observation/objective: Does not appear in distress.  Breathing is nonlabored  Assessment and plan: Patient is a 48 year old African-American female with history of iron deficiency anemia secondary to menorrhagia  Patient's hemoglobin improved from 7-11 after receiving IV iron.  However  after that she had another bout of heavy stool bleeding and her hemoglobin is back down to 8.8.  Iron studies continue to show iron deficiency with a ferritin level of 6 and iron saturation of 4%.  She is yet to undergo hysterectomy.  Explained to the patient that we can continue to give her IV iron but her hysterectomy needs to proceed as soon as possible to treat her ongoing iron deficiency.  I will schedule her for 2 more doses of Feraheme and repeat CBC in 1 month and 2 months and see her back in 2 months   Follow-up instructions:  I discussed the assessment and treatment plan with the patient. The patient was provided an opportunity to ask questions and all were answered. The patient agreed with the plan and demonstrated an understanding of the instructions.   The patient was advised to call back or seek an in-person evaluation if the symptoms worsen or if the condition fails to improve as anticipated.   Visit Diagnosis: 1. Iron deficiency anemia, unspecified iron deficiency anemia type     Dr. Randa Evens, MD, MPH [facility] [phone] 02/25/2019 7:59 AM

## 2019-02-26 ENCOUNTER — Ambulatory Visit: Payer: Self-pay | Admitting: Obstetrics and Gynecology

## 2019-02-26 ENCOUNTER — Other Ambulatory Visit: Payer: Self-pay

## 2019-02-26 VITALS — BP 132/90 | HR 88 | Ht 63.0 in | Wt 190.0 lb

## 2019-02-26 DIAGNOSIS — Z1231 Encounter for screening mammogram for malignant neoplasm of breast: Secondary | ICD-10-CM

## 2019-02-26 DIAGNOSIS — D500 Iron deficiency anemia secondary to blood loss (chronic): Secondary | ICD-10-CM

## 2019-02-26 DIAGNOSIS — N939 Abnormal uterine and vaginal bleeding, unspecified: Secondary | ICD-10-CM

## 2019-02-26 DIAGNOSIS — N800 Endometriosis of uterus: Secondary | ICD-10-CM

## 2019-02-26 DIAGNOSIS — D251 Intramural leiomyoma of uterus: Secondary | ICD-10-CM

## 2019-02-26 DIAGNOSIS — N8003 Adenomyosis of the uterus: Secondary | ICD-10-CM

## 2019-02-26 NOTE — Progress Notes (Signed)
Patient ID: Tiffany Rodriguez, female   DOB: [birth year], 48 y.o.   MRN: [id number]  Reason for Consult: Follow-up   Referred by No ref. provider found  Subjective:     HPI:  Tiffany Rodriguez is a 48 y.o. female. She is following up today regarding a hysterectomy. She had improved her hemoglobin to 8.3, but then she had 10 days of heavy bleeding and clot passage in late November.  Her hemoglobin this week was 8.8.  She has been taking Provera 10 mg daily. She is not currently having vaginal bleeding but has seen a small amount of spotting when she wipes.    Past Medical History:  Diagnosis Date  . Anemia   . Arthritis    right knee  . Asthma    Family History  Problem Relation Age of Onset  . Diabetes Paternal Grandmother   . Leukemia Paternal Uncle   . Leukemia Paternal Grandfather    Past Surgical History:  Procedure Laterality Date  . DILATION AND CURETTAGE OF UTERUS  2006  . KNEE ARTHROSCOPY      Short Social History:  Social History   Tobacco Use  . Smoking status: Current Every Day Smoker    Packs/day: 1.00    Types: Cigarettes  . Smokeless tobacco: Never Used  Substance Use Topics  . Alcohol use: No    No Known Allergies  Current Outpatient Medications  Medication Sig Dispense Refill  . albuterol (PROVENTIL HFA;VENTOLIN HFA) 108 (90 Base) MCG/ACT inhaler Inhale 2 puffs into the lungs every 6 (six) hours as needed for wheezing or shortness of breath. 1 Inhaler 2  . ferumoxytol (FERAHEME) 510 MG/17ML SOLN injection Inject 17 mLs (510 mg total) into the vein as directed. Infuse 510mg  Feraheme IV over at least 15 minutes at day 0 and repeat 3 to 8 days later. Dilute full contents of vial (91ml) per product insert instructions before use. 2 vials(12ml) 17 mL 0  . ibuprofen (ADVIL) 200 MG tablet Take 400 mg by mouth every 6 (six) hours as needed.    . medroxyPROGESTERone (PROVERA) 10 MG tablet Take 2 tablets (20 mg total) by mouth every 8 (eight) hours. Take 2  tablets three times a day for 7 days.: 42 Take 2 tablets two times a day for 7 days: 28 Take 2 tablets once a day for 7 days: 14 Take one tablet a day until directed to stop: 30 120 tablet 3   No current facility-administered medications for this visit.     REVIEW OF SYSTEMS      Objective:  Objective   Vitals:   02/26/19 1451  BP: 132/90  Pulse: 88  Weight: 190 lb (86.2 kg)  Height: 5\' 3"  (1.6 m)   Body mass index is 33.66 kg/m.  Physical Exam Vitals signs and nursing note reviewed.  Constitutional:      Appearance: She is well-developed.  HENT:     Head: Normocephalic and atraumatic.  Eyes:     Pupils: Pupils are equal, round, and reactive to light.  Cardiovascular:     Rate and Rhythm: Normal rate and regular rhythm.  Pulmonary:     Effort: Pulmonary effort is normal. No respiratory distress.  Skin:    General: Skin is warm and dry.  Neurological:     Mental Status: She is alert and oriented to person, place, and time.  Psychiatric:        Behavior: Behavior normal.        Thought Content: Thought content  normal.        Judgment: Judgment normal.         Assessment/Plan:     48 yo with enlarged uterus secondary to fibroids and adenomyosis with significant menorrhagia resulting in severe anemia desires definitve management with hysterectomy.  This procedure has been discussed in detail with the patient. She would like to have a robotic hysterectomy. She understands that there is a possibility given the size of her uterus that she may have to have the surgery converted to an open hysterectomy. Given the technical difficulty that is going to be associated with this case it will be scheduled in coordination with Dr. Theora Gianotti.  She will increase her Provera to 20 mg daily. She will call if she has another bleeding episode. She could have Lysteda or IV estrogen if needed.  Discussed the best ways to contact the clinic and on call physician.  Screening mammogram  ordered.   More than 15 minutes were spent face to face with the patient in the room with more than 50% of the time spent providing counseling and discussing the plan of management.Tiffany Prows MD Tiffany Rodriguez, [facility] 02/26/2019 9:53 PM

## 2019-03-02 ENCOUNTER — Ambulatory Visit (INDEPENDENT_AMBULATORY_CARE_PROVIDER_SITE_OTHER): Payer: Self-pay | Admitting: Urology

## 2019-03-02 ENCOUNTER — Other Ambulatory Visit: Payer: Self-pay

## 2019-03-02 ENCOUNTER — Encounter: Payer: Self-pay | Admitting: Urology

## 2019-03-02 VITALS — BP 144/96 | HR 91 | Ht 63.0 in | Wt 190.0 lb

## 2019-03-02 DIAGNOSIS — N393 Stress incontinence (female) (male): Secondary | ICD-10-CM

## 2019-03-02 DIAGNOSIS — N3946 Mixed incontinence: Secondary | ICD-10-CM

## 2019-03-02 LAB — URINALYSIS, COMPLETE
Bilirubin, UA: NEGATIVE
Glucose, UA: NEGATIVE
Ketones, UA: NEGATIVE
Leukocytes,UA: NEGATIVE
Nitrite, UA: NEGATIVE
Specific Gravity, UA: 1.02 (ref 1.005–1.030)
Urobilinogen, Ur: 0.2 mg/dL (ref 0.2–1.0)
pH, UA: 6 (ref 5.0–7.5)

## 2019-03-02 LAB — BLADDER SCAN AMB NON-IMAGING

## 2019-03-02 LAB — MICROSCOPIC EXAMINATION
Bacteria, UA: NONE SEEN
RBC, Urine: >30 /hpf — AB (ref 0–2)

## 2019-03-02 NOTE — Progress Notes (Signed)
03/02/2019 9:29 AM   Cory Roughen June 14, 1970 [id number]  Referring provider: Homero Fellers, [address] [place],  [place] [address]  Chief Complaint  Patient presents with  . Urinary Incontinence    HPI: I was consulted to assist the patient urinary incontinence.  She denies leaking with coughing sneezing bending lifting.  She has no urge incontinence.  She has moderately severe bedwetting.  She can leak a small amount with a trickling flow without awareness.  She can wear 5 pads a day moderately wet or soaked  She voids every 2 or 3 hours with a good flow and has no nocturia  She is planning to have a hysterectomy  She denies history of kidney stones previous to surgery urinary tract infections.  No neurologic issues.  Bowel movements normal.  No medical treatment  Modifying factors: There are no other modifying factors  Associated signs and symptoms: There are no other associated signs and symptoms Aggravating and relieving factors: There are no other aggravating or relieving factors Severity: Moderate Duration: Persistent   PMH: Past Medical History:  Diagnosis Date  . Anemia   . Arthritis    right knee  . Asthma     Surgical History: Past Surgical History:  Procedure Laterality Date  . DILATION AND CURETTAGE OF UTERUS  2006  . KNEE ARTHROSCOPY      Home Medications:  Allergies as of 03/02/2019   No Known Allergies     Medication List       Accurate as of March 02, 2019  9:29 AM. If you have any questions, ask your nurse or doctor.        albuterol 108 (90 Base) MCG/ACT inhaler Commonly known as: VENTOLIN HFA Inhale 2 puffs into the lungs every 6 (six) hours as needed for wheezing or shortness of breath.   ferumoxytol 510 MG/17ML Soln injection Commonly known as: FERAHEME Inject 17 mLs (510 mg total) into the vein as directed. Infuse 510mg  Feraheme IV over at least 15 minutes at day 0 and repeat 3 to 8 days later. Dilute full  contents of vial (32ml) per product insert instructions before use. 2 vials(73ml)   ibuprofen 200 MG tablet Commonly known as: ADVIL Take 400 mg by mouth every 6 (six) hours as needed.   medroxyPROGESTERone 10 MG tablet Commonly known as: PROVERA Take 2 tablets (20 mg total) by mouth every 8 (eight) hours. Take 2 tablets three times a day for 7 days.: 42 Take 2 tablets two times a day for 7 days: 28 Take 2 tablets once a day for 7 days: 14 Take one tablet a day until directed to stop: 30       Allergies: No Known Allergies  Family History: Family History  Problem Relation Age of Onset  . Diabetes Paternal Grandmother   . Leukemia Paternal Uncle   . Leukemia Paternal Grandfather     Social History:  reports that she has been smoking cigarettes. She has been smoking about 1.00 pack per day. She has never used smokeless tobacco. She reports that she does not drink alcohol or use drugs.  ROS: UROLOGY Frequent Urination?: No Hard to postpone urination?: Yes Burning/pain with urination?: No Get up at night to urinate?: No Leakage of urine?: Yes Urine stream starts and stops?: No Trouble starting stream?: No Do you have to strain to urinate?: No Blood in urine?: No Urinary tract infection?: No Sexually transmitted disease?: No Injury to kidneys or bladder?: No Painful intercourse?: No Weak  stream?: No Currently pregnant?: No Vaginal bleeding?: No Last menstrual period?: n  Gastrointestinal Nausea?: No Vomiting?: No Indigestion/heartburn?: No Diarrhea?: No Constipation?: No  Constitutional Fever: No Night sweats?: No Weight loss?: No Fatigue?: No  Skin Skin rash/lesions?: No Itching?: No  Eyes Blurred vision?: No Double vision?: No  Ears/Nose/Throat Sore throat?: No Sinus problems?: No  Hematologic/Lymphatic Swollen glands?: No Easy bruising?: No  Cardiovascular Leg swelling?: No Chest pain?: No  Respiratory Cough?: No Shortness of breath?:  No  Endocrine Excessive thirst?: No  Musculoskeletal Back pain?: No Joint pain?: No  Neurological Headaches?: No Dizziness?: No  Psychologic Depression?: No Anxiety?: No  Physical Exam: BP (!) 144/96   Pulse 91   Ht 5\' 3"  (1.6 m)   Wt 86.2 kg   BMI 33.66 kg/m   Constitutional:  Alert and oriented, No acute distress. HEENT: [place] AT, moist mucus membranes.  Trachea midline, no masses. Cardiovascular: No clubbing, cyanosis, or edema. Respiratory: Normal respiratory effort, no increased work of breathing. GI: Abdomen is soft, nontender, nondistended, no abdominal masses GU: On pelvic semination patient had no prolapse.  No stress incontinence.  Mild hypermobility the bladder neck. Skin: No rashes, bruises or suspicious lesions. Lymph: No cervical or inguinal adenopathy. Neurologic: Grossly intact, no focal deficits, moving all 4 extremities. Psychiatric: Normal mood and affect.  Laboratory Data: Lab Results  Component Value Date   WBC 5.5 02/23/2019   HGB 8.8 (L) 02/23/2019   HCT 31.8 (L) 02/23/2019   MCV 81.7 02/23/2019   PLT 307 02/23/2019    Lab Results  Component Value Date   CREATININE 0.70 12/30/2018    No results found for: PSA  No results found for: TESTOSTERONE  No results found for: HGBA1C  Urinalysis No results found for: COLORURINE, APPEARANCEUR, LABSPEC, PHURINE, GLUCOSEU, HGBUR, BILIRUBINUR, KETONESUR, PROTEINUR, UROBILINOGEN, NITRITE, LEUKOCYTESUR  Pertinent Imaging:   Assessment & Plan: Patient's presentation is somewhat out of the ordinary.  She has significant leakage but denies stress and urge incontinence.  She reported that her gynecologist thought that her uterus may be one of the culprits.  The patient is planning a hysterectomy for anemia from chronic irregular bleeding.  They were hoping to have ureteral stents at the time of her surgery due to size of uterus.  She understands that in rare cases a large uterus could be causing some  of her incontinence but this is not very common.  The role of urodynamics discussed.  One of my partners would likely need to place stents depending on day of surgery.  The patient and I decided not to investigate her incontinence hoping that it greatly improves with a hysterectomy.  I drew her a picture.  We will let her gynecologist know that if they need stents that they can work with our scheduler in our department.  If she still leaking a lot 3 or 4 months after surgery she will make another appointment then.  1. Stress incontinence, female  - Urinalysis, Complete   No follow-ups on file.  Reece Packer, MD  [facility] [address], [place], [place] [address] [phone]

## 2019-03-03 ENCOUNTER — Other Ambulatory Visit: Payer: Self-pay

## 2019-03-04 ENCOUNTER — Other Ambulatory Visit: Payer: Self-pay

## 2019-03-04 ENCOUNTER — Inpatient Hospital Stay: Payer: Self-pay | Attending: Oncology

## 2019-03-04 ENCOUNTER — Other Ambulatory Visit: Payer: Self-pay | Admitting: *Deleted

## 2019-03-04 VITALS — BP 149/91 | HR 87 | Temp 97.8°F | Resp 18

## 2019-03-04 DIAGNOSIS — D509 Iron deficiency anemia, unspecified: Secondary | ICD-10-CM | POA: Insufficient documentation

## 2019-03-04 MED ORDER — SODIUM CHLORIDE 0.9 % IV SOLN
510.0000 mg | Freq: Once | INTRAVENOUS | Status: AC
  Administered 2019-03-04: 510 mg via INTRAVENOUS
  Filled 2019-03-04: qty 17

## 2019-03-04 MED ORDER — FERUMOXYTOL INJECTION 510 MG/17 ML: 510.0000 mg | INTRAVENOUS | 0 refills | Status: DC

## 2019-03-04 MED ORDER — CYANOCOBALAMIN 1000 MCG/ML IJ SOLN
1000.0000 ug | INTRAMUSCULAR | Status: DC
  Filled 2019-03-04: qty 1

## 2019-03-04 MED ORDER — SODIUM CHLORIDE 0.9 % IV SOLN
Freq: Once | INTRAVENOUS | Status: AC
  Administered 2019-03-04: 14:00:00 via INTRAVENOUS
  Filled 2019-03-04: qty 250

## 2019-03-06 ENCOUNTER — Telehealth: Payer: Self-pay | Admitting: Obstetrics and Gynecology

## 2019-03-06 NOTE — Telephone Encounter (Signed)
Patient is aware of rescheduled OR date, from 1/19 to 04/15/19 with Dr. Gilman Schmidt and Dr. Theora Gianotti.

## 2019-03-06 NOTE — Telephone Encounter (Signed)
Patient is aware of H&P at [facility] on 1/11 @ 2:50pm w/ Dr. Gilman Schmidt, Pre-admit testing to be scheduled, COVID testing on 1/15, and OR on 04/13/18. Patient is aware to quarantine after COVID testing. Patient is aware she may receive calls from the [facility] and [facility]. Patient confirmed no insurance, and said she has already spoken to someone in [employer], and is waiting on some paperwork.

## 2019-03-11 ENCOUNTER — Other Ambulatory Visit: Payer: Self-pay | Admitting: Obstetrics and Gynecology

## 2019-03-11 ENCOUNTER — Other Ambulatory Visit: Payer: Self-pay

## 2019-03-11 ENCOUNTER — Inpatient Hospital Stay: Payer: Self-pay

## 2019-03-11 VITALS — BP 143/84 | HR 69 | Temp 98.2°F | Resp 18

## 2019-03-11 DIAGNOSIS — D509 Iron deficiency anemia, unspecified: Secondary | ICD-10-CM

## 2019-03-11 MED ORDER — SODIUM CHLORIDE 0.9 % IV SOLN
Freq: Once | INTRAVENOUS | Status: AC
  Filled 2019-03-11: qty 250

## 2019-03-11 MED ORDER — SODIUM CHLORIDE 0.9 % IV SOLN
510.0000 mg | Freq: Once | INTRAVENOUS | Status: AC
  Administered 2019-03-11: 14:00:00 510 mg via INTRAVENOUS
  Filled 2019-03-11: qty 510

## 2019-03-11 NOTE — Telephone Encounter (Signed)
I noticed that this is boarded as a "Radical hysterectomy"  This is not correct.  Robotic hysterectomy with bilateral salpingectomy, possible oophorectomy with cystoscopy is how it should be boarded.  Orders placed for preop

## 2019-03-24 ENCOUNTER — Other Ambulatory Visit: Payer: Self-pay

## 2019-03-25 ENCOUNTER — Other Ambulatory Visit: Payer: Self-pay

## 2019-03-25 ENCOUNTER — Inpatient Hospital Stay: Payer: Self-pay

## 2019-03-25 DIAGNOSIS — D509 Iron deficiency anemia, unspecified: Secondary | ICD-10-CM

## 2019-03-25 LAB — CBC WITH DIFFERENTIAL/PLATELET
Abs Immature Granulocytes: 0.01 10*3/uL (ref 0.00–0.07)
Basophils Absolute: 0.1 10*3/uL (ref 0.0–0.1)
Basophils Relative: 2 %
Eosinophils Absolute: 0.2 10*3/uL (ref 0.0–0.5)
Eosinophils Relative: 3 %
HCT: 44.3 % (ref 36.0–46.0)
Hemoglobin: 12.8 g/dL (ref 12.0–15.0)
Immature Granulocytes: 0 %
Lymphocytes Relative: 34 %
Lymphs Abs: 1.6 10*3/uL (ref 0.7–4.0)
MCH: 25 pg — ABNORMAL LOW (ref 26.0–34.0)
MCHC: 28.9 g/dL — ABNORMAL LOW (ref 30.0–36.0)
MCV: 86.7 fL (ref 80.0–100.0)
Monocytes Absolute: 0.4 10*3/uL (ref 0.1–1.0)
Monocytes Relative: 8 %
Neutro Abs: 2.4 10*3/uL (ref 1.7–7.7)
Neutrophils Relative %: 53 %
Platelets: 317 10*3/uL (ref 150–400)
RBC: 5.11 MIL/uL (ref 3.87–5.11)
RDW: 24.5 % — ABNORMAL HIGH (ref 11.5–15.5)
WBC: 4.6 10*3/uL (ref 4.0–10.5)
nRBC: 0 % (ref 0.0–0.2)

## 2019-03-31 ENCOUNTER — Other Ambulatory Visit: Payer: Self-pay | Admitting: Obstetrics and Gynecology

## 2019-03-31 DIAGNOSIS — N939 Abnormal uterine and vaginal bleeding, unspecified: Secondary | ICD-10-CM

## 2019-03-31 MED ORDER — TRANEXAMIC ACID 650 MG PO TABS: 1300.0000 mg | ORAL_TABLET | Freq: Three times a day (TID) | ORAL | 0 refills | Status: DC

## 2019-04-06 ENCOUNTER — Encounter: Payer: Self-pay | Admitting: Obstetrics and Gynecology

## 2019-04-06 ENCOUNTER — Ambulatory Visit (INDEPENDENT_AMBULATORY_CARE_PROVIDER_SITE_OTHER): Payer: Self-pay | Admitting: Obstetrics and Gynecology

## 2019-04-06 ENCOUNTER — Other Ambulatory Visit: Payer: Self-pay

## 2019-04-06 VITALS — BP 138/84 | Ht 63.0 in | Wt 198.0 lb

## 2019-04-06 DIAGNOSIS — D62 Acute posthemorrhagic anemia: Secondary | ICD-10-CM

## 2019-04-06 DIAGNOSIS — N8003 Adenomyosis of the uterus: Secondary | ICD-10-CM

## 2019-04-06 DIAGNOSIS — D500 Iron deficiency anemia secondary to blood loss (chronic): Secondary | ICD-10-CM

## 2019-04-06 DIAGNOSIS — D251 Intramural leiomyoma of uterus: Secondary | ICD-10-CM

## 2019-04-06 DIAGNOSIS — N939 Abnormal uterine and vaginal bleeding, unspecified: Secondary | ICD-10-CM

## 2019-04-06 DIAGNOSIS — N800 Endometriosis of uterus: Secondary | ICD-10-CM

## 2019-04-06 NOTE — H&P (View-Only) (Signed)
Patient ID: Tranika Suggs, female   DOB: 05-09-1970, 49 y.o.   MRN: [id number]  Reason for Consult: Pre-op Exam   Referred by No ref. provider found  Subjective:     HPI:  Ellieanna Richerson is a 49 y.o. female. She is being followed for heavy uterine bleeding related to a enlarged fibroid uterus with adenomyosis.  Recently she has been taking Provera 20 mg a day. She completed a 5 day course of Lysteda for breakthrough bleeding.   She has had multiple blood transfusions of acute bleeding episodes in the past. She was hospitalized as recently as September for uterine bleeding and anemia.  She has been following with hematology for IV iron transfusions to help treat her severe anemia in preparation for surgery.  She has seen urology who will assist with her surgery by placing ureteral stents. Treatment for stress incontinence concurrently was not advised.   Gynecological History Menarche: 13 Menopause: not applicable Describes periods as previously regular montly periods until about 1.5 years ago. Recently her periods have been irregular and heavy.  Denies a history of fibroids or endometriosis.  Last pap smear: Unknown, more than 10 years ago, possible 2006 Last Mammogram: Never History of STDs: Remote hx of gonorrhea at 55 years age. Treated for PID in 2004 and 2005  Sexually Active: Not in 10 + years. History of dyspareunia.   Obstetrical History [id number] History of 2 full term SVDs.   Past Medical History:  Diagnosis Date  . Anemia   . Arthritis    right knee  . Asthma    Family History  Problem Relation Age of Onset  . Diabetes Paternal Grandmother   . Leukemia Paternal Uncle   . Leukemia Paternal Grandfather    Past Surgical History:  Procedure Laterality Date  . DILATION AND CURETTAGE OF UTERUS  2006  . KNEE ARTHROSCOPY      Short Social History:  Social History   Tobacco Use  . Smoking status: Current Every Day Smoker    Packs/day: 1.00    Types:  Cigarettes  . Smokeless tobacco: Never Used  Substance Use Topics  . Alcohol use: No    No Known Allergies  Current Outpatient Medications  Medication Sig Dispense Refill  . albuterol (PROVENTIL HFA;VENTOLIN HFA) 108 (90 Base) MCG/ACT inhaler Inhale 2 puffs into the lungs every 6 (six) hours as needed for wheezing or shortness of breath. 1 Inhaler 2  . ibuprofen (ADVIL) 200 MG tablet Take 400 mg by mouth every 6 (six) hours as needed for moderate pain.     Marland Kitchen olopatadine (PATADAY) 0.1 % ophthalmic solution Place 1 drop into both eyes daily as needed for allergies.    . ferumoxytol (FERAHEME) 510 MG/17ML SOLN injection Inject 17 mLs (510 mg total) into the vein as directed. Infuse 510mg  Feraheme IV over at least 15 minutes at day 0 and repeat 3 to 8 days later. Dilute full contents of vial (66ml) per product insert instructions before use. 2 vials(41ml) (Patient not taking: Reported on 03/30/2019) 17 mL 0  . medroxyPROGESTERone (PROVERA) 10 MG tablet Take 2 tablets (20 mg total) by mouth every 8 (eight) hours. Take 2 tablets three times a day for 7 days.: 42 Take 2 tablets two times a day for 7 days: 28 Take 2 tablets once a day for 7 days: 14 Take one tablet a day until directed to stop: 30 (Patient not taking: Reported on 04/06/2019) 120 tablet 3  . tranexamic acid (LYSTEDA) 650 MG TABS  tablet Take 2 tablets (1,300 mg total) by mouth 3 (three) times daily. Take during menses for a maximum of five days (Patient not taking: Reported on 04/06/2019) 30 tablet 0   No current facility-administered medications for this visit.    Review of Systems  Constitutional: Negative for chills, fatigue, fever and unexpected weight change.  HENT: Negative for trouble swallowing.  Eyes: Negative for loss of vision.  Respiratory: Negative for cough, shortness of breath and wheezing.  Cardiovascular: Negative for chest pain, leg swelling, palpitations and syncope.  GI: Negative for abdominal pain, blood in stool,  diarrhea, nausea and vomiting.  GU: Negative for difficulty urinating, dysuria, frequency and hematuria.  Musculoskeletal: Negative for back pain, leg pain and joint pain.  Skin: Negative for rash.  Neurological: Negative for dizziness, headaches, light-headedness, numbness and seizures.  Psychiatric: Negative for behavioral problem, confusion, depressed mood and sleep disturbance.        Objective:  Objective   Vitals:   04/06/19 1515  BP: 138/84  Weight: 198 lb (89.8 kg)  Height: 5\' 3"  (1.6 m)   Body mass index is 35.07 kg/m.  Physical Exam Vitals and nursing note reviewed.  Constitutional:      Appearance: She is well-developed.  HENT:     Head: Normocephalic and atraumatic.  Cardiovascular:     Rate and Rhythm: Normal rate and regular rhythm.  Pulmonary:     Effort: Pulmonary effort is normal.     Breath sounds: Normal breath sounds.  Abdominal:     General: Bowel sounds are normal.     Palpations: Abdomen is soft.  Musculoskeletal:        General: Normal range of motion.  Skin:    General: Skin is warm and dry.  Neurological:     Mental Status: She is alert and oriented to person, place, and time.  Psychiatric:        Behavior: Behavior normal.        Thought Content: Thought content normal.        Judgment: Judgment normal.          Assessment/Plan:     49 yo with menorrhagia related to an enlarged fibroid uterus and adenomyosis with chronic blood loss anemia which is improving.   Discussed the surgical approach options of robotic hysterectomy versus laparotomy. She understands that with a robotic hysterectomy she would need an abdominal incision to remove the uterus and fallopian tubes after bag morcellation. She understands that her surgery may be technically challenging given the large size of her uterus. Her surgery has been planned with gynecological oncology because of a high degree of complexity expected. Conversion to laparotomy is possible. The  decision to remove or retain her ovaries will have to be based on surgical feasibility. If possible her ovaries will be retained. If her ovaries are removed she would require hormone replacement therapy as she is premenopausal.   I have had a careful discussion with this patient about all the surgical options available and the risk/benefits of each. I have fully informed this patient that a hysterectomy may subject her to a variety of discomforts and risks: She understands that most patients have surgery with little difficulty, but problems can happen ranging from minor to fatal. These include nausea, vomiting, pain, bleeding, infection, poor healing, hernia, wound separation, vaginal cuff separation or formation of adhesions. Unexpected reactions may occur from any drug or anesthetic given. Unintended injury may occur to other pelvic or abdominal structures such as Fallopian tubes,  ovaries, bladder, ureter (tube from kidney to bladder), or bowel. Nerves going from the pelvis to the legs may be injured. Any such injury may require immediate or later additional surgery to correct the problem. Excessive blood loss requiring transfusion is very unlikely but possible. Dangerous blood clots may form in the legs or lungs. Physical and sexual activity will be restricted in varying degrees for an indeterminate period of time but most often 4-6 weeks. She understands that the plan is to do this by robot assisted laparoscopy, however, there is a chance that this will need to be performed via a larger incision. She may be hospitalized overnight. Finally, she understands that it is impossible to list every possible undesirable effect and that the condition for which surgery is done is not always cured or significantly improved, and in rare cases may be even worse. I have also counseled her extensively about the pros and cons of ovarian conservation versus removal. Ample time was given to answer all questions. She was  consented for the procedure.   Reminded Ellington that she still needs her mammogram. She said she will call to schedule and still has the phone number.   More than 45 minutes were spent face to face with the patient in the room with more than 50% of the time spent providing counseling and discussing the plan of management.    Adrian Prows MD [facility] 04/06/2019 9:43 PM

## 2019-04-06 NOTE — Progress Notes (Signed)
Patient ID: Tiffany Rodriguez, female   DOB: [birth year], 49 y.o.   MRN: [id number]  Reason for Consult: Pre-op Exam   Referred by No ref. provider found  Subjective:     HPI:  Tiffany Rodriguez is a 49 y.o. female. She is being followed for heavy uterine bleeding related to a enlarged fibroid uterus with adenomyosis.  Recently she has been taking Provera 20 mg a day. She completed a 5 day course of Lysteda for breakthrough bleeding.   She has had multiple blood transfusions of acute bleeding episodes in the past. She was hospitalized as recently as September for uterine bleeding and anemia.  She has been following with hematology for IV iron transfusions to help treat her severe anemia in preparation for surgery.  She has seen urology who will assist with her surgery by placing ureteral stents. Treatment for stress incontinence concurrently was not advised.   Gynecological History Menarche: 13 Menopause: not applicable Describes periods as previously regular montly periods until about 1.5 years ago. Recently her periods have been irregular and heavy.  Denies a history of fibroids or endometriosis.  Last pap smear: Unknown, more than 10 years ago, possible 2006 Last Mammogram: Never History of STDs: Remote hx of gonorrhea at 57 years age. Treated for PID in 2004 and 2005  Sexually Active: Not in 10 + years. History of dyspareunia.   Obstetrical History [id number] History of 2 full term SVDs.   Past Medical History:  Diagnosis Date  . Anemia   . Arthritis    right knee  . Asthma    Family History  Problem Relation Age of Onset  . Diabetes Paternal Grandmother   . Leukemia Paternal Uncle   . Leukemia Paternal Grandfather    Past Surgical History:  Procedure Laterality Date  . DILATION AND CURETTAGE OF UTERUS  2006  . KNEE ARTHROSCOPY      Short Social History:  Social History   Tobacco Use  . Smoking status: Current Every Day Smoker    Packs/day: 1.00    Types:  Cigarettes  . Smokeless tobacco: Never Used  Substance Use Topics  . Alcohol use: No    No Known Allergies  Current Outpatient Medications  Medication Sig Dispense Refill  . albuterol (PROVENTIL HFA;VENTOLIN HFA) 108 (90 Base) MCG/ACT inhaler Inhale 2 puffs into the lungs every 6 (six) hours as needed for wheezing or shortness of breath. 1 Inhaler 2  . ibuprofen (ADVIL) 200 MG tablet Take 400 mg by mouth every 6 (six) hours as needed for moderate pain.     Marland Kitchen olopatadine (PATADAY) 0.1 % ophthalmic solution Place 1 drop into both eyes daily as needed for allergies.    . ferumoxytol (FERAHEME) 510 MG/17ML SOLN injection Inject 17 mLs (510 mg total) into the vein as directed. Infuse 510mg  Feraheme IV over at least 15 minutes at day 0 and repeat 3 to 8 days later. Dilute full contents of vial (84ml) per product insert instructions before use. 2 vials(81ml) (Patient not taking: Reported on 03/30/2019) 17 mL 0  . medroxyPROGESTERone (PROVERA) 10 MG tablet Take 2 tablets (20 mg total) by mouth every 8 (eight) hours. Take 2 tablets three times a day for 7 days.: 42 Take 2 tablets two times a day for 7 days: 28 Take 2 tablets once a day for 7 days: 14 Take one tablet a day until directed to stop: 30 (Patient not taking: Reported on 04/06/2019) 120 tablet 3  . tranexamic acid (LYSTEDA) 650 MG TABS  tablet Take 2 tablets (1,300 mg total) by mouth 3 (three) times daily. Take during menses for a maximum of five days (Patient not taking: Reported on 04/06/2019) 30 tablet 0   No current facility-administered medications for this visit.    Review of Systems  Constitutional: Negative for chills, fatigue, fever and unexpected weight change.  HENT: Negative for trouble swallowing.  Eyes: Negative for loss of vision.  Respiratory: Negative for cough, shortness of breath and wheezing.  Cardiovascular: Negative for chest pain, leg swelling, palpitations and syncope.  GI: Negative for abdominal pain, blood in stool,  diarrhea, nausea and vomiting.  GU: Negative for difficulty urinating, dysuria, frequency and hematuria.  Musculoskeletal: Negative for back pain, leg pain and joint pain.  Skin: Negative for rash.  Neurological: Negative for dizziness, headaches, light-headedness, numbness and seizures.  Psychiatric: Negative for behavioral problem, confusion, depressed mood and sleep disturbance.        Objective:  Objective   Vitals:   04/06/19 1515  BP: 138/84  Weight: 198 lb (89.8 kg)  Height: 5\' 3"  (1.6 m)   Body mass index is 35.07 kg/m.  Physical Exam Vitals and nursing note reviewed.  Constitutional:      Appearance: She is well-developed.  HENT:     Head: Normocephalic and atraumatic.  Cardiovascular:     Rate and Rhythm: Normal rate and regular rhythm.  Pulmonary:     Effort: Pulmonary effort is normal.     Breath sounds: Normal breath sounds.  Abdominal:     General: Bowel sounds are normal.     Palpations: Abdomen is soft.  Musculoskeletal:        General: Normal range of motion.  Skin:    General: Skin is warm and dry.  Neurological:     Mental Status: She is alert and oriented to person, place, and time.  Psychiatric:        Behavior: Behavior normal.        Thought Content: Thought content normal.        Judgment: Judgment normal.          Assessment/Plan:     49 yo with menorrhagia related to an enlarged fibroid uterus and adenomyosis with chronic blood loss anemia which is improving.   Discussed the surgical approach options of robotic hysterectomy versus laparotomy. She understands that with a robotic hysterectomy she would need an abdominal incision to remove the uterus and fallopian tubes after bag morcellation. She understands that her surgery may be technically challenging given the large size of her uterus. Her surgery has been planned with gynecological oncology because of a high degree of complexity expected. Conversion to laparotomy is possible. The  decision to remove or retain her ovaries will have to be based on surgical feasibility. If possible her ovaries will be retained. If her ovaries are removed she would require hormone replacement therapy as she is premenopausal.   I have had a careful discussion with this patient about all the surgical options available and the risk/benefits of each. I have fully informed this patient that a hysterectomy may subject her to a variety of discomforts and risks: She understands that most patients have surgery with little difficulty, but problems can happen ranging from minor to fatal. These include nausea, vomiting, pain, bleeding, infection, poor healing, hernia, wound separation, vaginal cuff separation or formation of adhesions. Unexpected reactions may occur from any drug or anesthetic given. Unintended injury may occur to other pelvic or abdominal structures such as Fallopian tubes,  ovaries, bladder, ureter (tube from kidney to bladder), or bowel. Nerves going from the pelvis to the legs may be injured. Any such injury may require immediate or later additional surgery to correct the problem. Excessive blood loss requiring transfusion is very unlikely but possible. Dangerous blood clots may form in the legs or lungs. Physical and sexual activity will be restricted in varying degrees for an indeterminate period of time but most often 4-6 weeks. She understands that the plan is to do this by robot assisted laparoscopy, however, there is a chance that this will need to be performed via a larger incision. She may be hospitalized overnight. Finally, she understands that it is impossible to list every possible undesirable effect and that the condition for which surgery is done is not always cured or significantly improved, and in rare cases may be even worse. I have also counseled her extensively about the pros and cons of ovarian conservation versus removal. Ample time was given to answer all questions. She was  consented for the procedure.   Reminded Johnni that she still needs her mammogram. She said she will call to schedule and still has the phone number.   More than 45 minutes were spent face to face with the patient in the room with more than 50% of the time spent providing counseling and discussing the plan of management.    Adrian Prows MD [facility] 04/06/2019 9:43 PM

## 2019-04-07 ENCOUNTER — Other Ambulatory Visit: Payer: Self-pay

## 2019-04-07 ENCOUNTER — Encounter
Admission: RE | Admit: 2019-04-07 | Discharge: 2019-04-07 | Disposition: A | Discharge status: Home / Self Care | Payer: Self-pay | Source: Ambulatory Visit | Attending: Obstetrics and Gynecology | Admitting: Obstetrics and Gynecology

## 2019-04-07 DIAGNOSIS — Z01818 Encounter for other preprocedural examination: Secondary | ICD-10-CM | POA: Insufficient documentation

## 2019-04-07 NOTE — Patient Instructions (Signed)
Your procedure is scheduled on: Wed. 1/20 Report to Day Surgery.[facility] To find out your arrival time please call [phone] between 1PM - 3PM on Tues 1/19.  Remember: Instructions that are not followed completely may result in serious medical risk,  up to and including death, or upon the discretion of your surgeon and anesthesiologist your  surgery may need to be rescheduled.     _X__ 1. Do not eat food after midnight the night before your procedure.                 No gum chewing or hard candies. You may drink clear liquids up to 2 hours                 before you are scheduled to arrive for your surgery- DO not drink clear                 liquids within 2 hours of the start of your surgery.                 Clear Liquids include:  water, apple juice without pulp, clear carbohydrate                 Drink(Ensure) Gatorade, Black Coffee or Tea (Do not add                 anything to coffee or tea). Complete the Ensure 2 hours before you arrive on the day of surgery.  Then stop drinking  __X__2.  On the morning of surgery brush your teeth with toothpaste and water, you                may rinse your mouth with mouthwash if you wish.  Do not swallow any toothpaste of mouthwash.     _X__ 3.  No Alcohol for 24 hours before or after surgery.   _X__ 4.  Do Not Smoke or use e-cigarettes For 24 Hours Prior to Your Surgery.                 Do not use any chewable tobacco products for at least 6 hours prior to                 surgery.  ____  5.  Bring all medications with you on the day of surgery if instructed.   __x__  6.  Notify your doctor if there is any change in your medical condition      (cold, fever, infections).     Do not wear jewelry, make-up, hairpins, clips or nail polish. Do not wear lotions, powders, or perfumes. You may wear deodorant. Do not shave 48 hours prior to surgery. Men may shave face and neck. Do not bring valuables to the hospital.     [facility] is not responsible for any belongings or valuables.  Contacts, dentures or bridgework may not be worn into surgery. Leave your suitcase in the car. After surgery it may be brought to your room. For patients admitted to the hospital, discharge time is determined by your treatment team.   Patients discharged the day of surgery will not be allowed to drive home.   Please read over the following fact sheets that you were given:     __x__ Take these medicines the morning of surgery with A SIP OF WATER:    1. none  2.   3.   4.  5.  6.  ____ Fleet Enema (as directed)  __x__ Use CHG Soap as directed  ____ Use inhalers on the day of surgery  (Not using her inhaler at this time)  ____ Stop metformin 2 days prior to surgery    ____ Take 1/2 of usual insulin dose the night before surgery. No insulin the morning          of surgery.   ____ Stop Coumadin/Plavix/aspirin on  __x__ Stop Anti-inflammatories ibuprofen aleve or aspirin products   May take tylenol   ____ Stop supplements until after surgery.    ____ Bring C-Pap to the hospital.

## 2019-04-13 ENCOUNTER — Other Ambulatory Visit
Admission: RE | Admit: 2019-04-13 | Discharge: 2019-04-13 | Disposition: A | Discharge status: Home / Self Care | Payer: 59 | Source: Ambulatory Visit | Attending: Obstetrics and Gynecology | Admitting: Obstetrics and Gynecology

## 2019-04-13 DIAGNOSIS — Z20822 Contact with and (suspected) exposure to covid-19: Secondary | ICD-10-CM | POA: Diagnosis not present

## 2019-04-13 DIAGNOSIS — Z01812 Encounter for preprocedural laboratory examination: Secondary | ICD-10-CM | POA: Diagnosis present

## 2019-04-13 LAB — CBC
HCT: 41.2 % (ref 36.0–46.0)
Hemoglobin: 12.4 g/dL (ref 12.0–15.0)
MCH: 24.9 pg — ABNORMAL LOW (ref 26.0–34.0)
MCHC: 30.1 g/dL (ref 30.0–36.0)
MCV: 82.7 fL (ref 80.0–100.0)
Platelets: 266 10*3/uL (ref 150–400)
RBC: 4.98 MIL/uL (ref 3.87–5.11)
RDW: 21.2 % — ABNORMAL HIGH (ref 11.5–15.5)
WBC: 4.1 10*3/uL (ref 4.0–10.5)
nRBC: 0 % (ref 0.0–0.2)

## 2019-04-13 LAB — TYPE AND SCREEN
ABO/RH(D): AB POS
Antibody Screen: NEGATIVE

## 2019-04-13 LAB — SARS CORONAVIRUS 2 (TAT 6-24 HRS): SARS Coronavirus 2: NEGATIVE

## 2019-04-15 ENCOUNTER — Observation Stay
Admission: RE | Admit: 2019-04-15 | Discharge: 2019-04-16 | Disposition: A | Discharge status: Home / Self Care | Payer: 59 | Attending: Obstetrics and Gynecology | Admitting: Obstetrics and Gynecology

## 2019-04-15 ENCOUNTER — Ambulatory Visit: Payer: 59

## 2019-04-15 ENCOUNTER — Telehealth: Payer: Self-pay | Admitting: Urology

## 2019-04-15 ENCOUNTER — Encounter
Admission: RE | Disposition: A | Discharge status: Home / Self Care | Payer: Self-pay | Source: Home / Self Care | Attending: Obstetrics and Gynecology

## 2019-04-15 ENCOUNTER — Encounter: Payer: Self-pay | Admitting: Obstetrics and Gynecology

## 2019-04-15 ENCOUNTER — Ambulatory Visit: Payer: 59 | Admitting: Family

## 2019-04-15 ENCOUNTER — Other Ambulatory Visit: Payer: Self-pay

## 2019-04-15 DIAGNOSIS — Z79899 Other long term (current) drug therapy: Secondary | ICD-10-CM | POA: Diagnosis not present

## 2019-04-15 DIAGNOSIS — N879 Dysplasia of cervix uteri, unspecified: Secondary | ICD-10-CM

## 2019-04-15 DIAGNOSIS — Z793 Long term (current) use of hormonal contraceptives: Secondary | ICD-10-CM | POA: Insufficient documentation

## 2019-04-15 DIAGNOSIS — N858 Other specified noninflammatory disorders of uterus: Secondary | ICD-10-CM

## 2019-04-15 DIAGNOSIS — Z90721 Acquired absence of ovaries, unilateral: Secondary | ICD-10-CM | POA: Diagnosis present

## 2019-04-15 DIAGNOSIS — Z960 Presence of urogenital implants: Secondary | ICD-10-CM

## 2019-04-15 DIAGNOSIS — N72 Inflammatory disease of cervix uteri: Secondary | ICD-10-CM | POA: Insufficient documentation

## 2019-04-15 DIAGNOSIS — F17210 Nicotine dependence, cigarettes, uncomplicated: Secondary | ICD-10-CM | POA: Insufficient documentation

## 2019-04-15 DIAGNOSIS — N800 Endometriosis of uterus: Secondary | ICD-10-CM

## 2019-04-15 DIAGNOSIS — D259 Leiomyoma of uterus, unspecified: Secondary | ICD-10-CM | POA: Diagnosis not present

## 2019-04-15 DIAGNOSIS — J45909 Unspecified asthma, uncomplicated: Secondary | ICD-10-CM | POA: Insufficient documentation

## 2019-04-15 DIAGNOSIS — Z90710 Acquired absence of both cervix and uterus: Secondary | ICD-10-CM | POA: Diagnosis present

## 2019-04-15 DIAGNOSIS — D219 Benign neoplasm of connective and other soft tissue, unspecified: Secondary | ICD-10-CM

## 2019-04-15 DIAGNOSIS — K668 Other specified disorders of peritoneum: Secondary | ICD-10-CM

## 2019-04-15 DIAGNOSIS — N888 Other specified noninflammatory disorders of cervix uteri: Secondary | ICD-10-CM | POA: Diagnosis not present

## 2019-04-15 DIAGNOSIS — N7011 Chronic salpingitis: Secondary | ICD-10-CM

## 2019-04-15 DIAGNOSIS — D251 Intramural leiomyoma of uterus: Secondary | ICD-10-CM

## 2019-04-15 DIAGNOSIS — D500 Iron deficiency anemia secondary to blood loss (chronic): Secondary | ICD-10-CM | POA: Diagnosis not present

## 2019-04-15 DIAGNOSIS — N856 Intrauterine synechiae: Secondary | ICD-10-CM

## 2019-04-15 DIAGNOSIS — Z419 Encounter for procedure for purposes other than remedying health state, unspecified: Secondary | ICD-10-CM

## 2019-04-15 DIAGNOSIS — N736 Female pelvic peritoneal adhesions (postinfective): Secondary | ICD-10-CM

## 2019-04-15 HISTORY — PX: CYSTOSCOPY W/ URETERAL STENT PLACEMENT: SHX1429

## 2019-04-15 HISTORY — PX: ROBOTIC ASSISTED TOTAL HYSTERECTOMY WITH BILATERAL SALPINGO OOPHERECTOMY: SHX6086

## 2019-04-15 SURGERY — HYSTERECTOMY, TOTAL, ROBOT-ASSISTED, LAPAROSCOPIC, WITH BILATERAL SALPINGO-OOPHORECTOMY
Anesthesia: General | Site: Ureter

## 2019-04-15 MED ORDER — MIDAZOLAM HCL 2 MG/2ML IJ SOLN
INTRAMUSCULAR | Status: AC
  Filled 2019-04-15: qty 2

## 2019-04-15 MED ORDER — FENTANYL CITRATE (PF) 100 MCG/2ML IJ SOLN
INTRAMUSCULAR | Status: AC
  Administered 2019-04-15: 25 ug via INTRAVENOUS
  Filled 2019-04-15: qty 2

## 2019-04-15 MED ORDER — ACETAMINOPHEN 10 MG/ML IV SOLN
INTRAVENOUS | Status: AC
  Filled 2019-04-15: qty 100

## 2019-04-15 MED ORDER — FAMOTIDINE 20 MG PO TABS
20.0000 mg | ORAL_TABLET | Freq: Once | ORAL | Status: AC
  Administered 2019-04-15: 08:00:00 20 mg via ORAL

## 2019-04-15 MED ORDER — SUGAMMADEX SODIUM 200 MG/2ML IV SOLN
INTRAVENOUS | Status: DC | PRN
  Administered 2019-04-15: 200 mg via INTRAVENOUS

## 2019-04-15 MED ORDER — POLYETHYLENE GLYCOL 3350 17 G PO PACK
17.0000 g | PACK | Freq: Every day | ORAL | Status: DC | PRN
  Administered 2019-04-16: 18:00:00 17 g via ORAL
  Filled 2019-04-15 (×3): qty 1

## 2019-04-15 MED ORDER — CEFAZOLIN SODIUM-DEXTROSE 2-4 GM/100ML-% IV SOLN
2.0000 g | INTRAVENOUS | Status: AC
  Administered 2019-04-15 (×2): 2 g via INTRAVENOUS

## 2019-04-15 MED ORDER — ONDANSETRON HCL 4 MG/2ML IJ SOLN
4.0000 mg | Freq: Four times a day (QID) | INTRAMUSCULAR | Status: DC | PRN
  Administered 2019-04-16: 4 mg via INTRAVENOUS
  Filled 2019-04-15: qty 2

## 2019-04-15 MED ORDER — ROCURONIUM BROMIDE 50 MG/5ML IV SOLN
INTRAVENOUS | Status: AC
  Filled 2019-04-15: qty 1

## 2019-04-15 MED ORDER — BISACODYL 10 MG RE SUPP: 10.0000 mg | Freq: Every day | RECTAL | Status: DC | PRN

## 2019-04-15 MED ORDER — KETOROLAC TROMETHAMINE 30 MG/ML IJ SOLN
30.0000 mg | Freq: Four times a day (QID) | INTRAMUSCULAR | Status: AC
  Administered 2019-04-16 (×4): 30 mg via INTRAVENOUS
  Filled 2019-04-15 (×4): qty 1

## 2019-04-15 MED ORDER — OXYCODONE HCL 5 MG PO TABS
5.0000 mg | ORAL_TABLET | ORAL | Status: DC | PRN
  Administered 2019-04-16 (×3): 10 mg via ORAL
  Filled 2019-04-15 (×3): qty 2

## 2019-04-15 MED ORDER — ACETAMINOPHEN 10 MG/ML IV SOLN
INTRAVENOUS | Status: DC | PRN
  Administered 2019-04-15: 1000 mg via INTRAVENOUS

## 2019-04-15 MED ORDER — ONDANSETRON HCL 4 MG PO TABS: 4.0000 mg | ORAL_TABLET | Freq: Four times a day (QID) | ORAL | Status: DC | PRN

## 2019-04-15 MED ORDER — DEXMEDETOMIDINE HCL 200 MCG/2ML IV SOLN
INTRAVENOUS | Status: DC | PRN
  Administered 2019-04-15 (×2): 20 ug via INTRAVENOUS

## 2019-04-15 MED ORDER — LACTATED RINGERS IV SOLN: INTRAVENOUS | Status: DC

## 2019-04-15 MED ORDER — ACETAMINOPHEN 500 MG PO TABS
1000.0000 mg | ORAL_TABLET | Freq: Four times a day (QID) | ORAL | Status: DC
  Administered 2019-04-16: 1000 mg via ORAL
  Filled 2019-04-15 (×2): qty 2

## 2019-04-15 MED ORDER — DEXAMETHASONE SODIUM PHOSPHATE 10 MG/ML IJ SOLN
INTRAMUSCULAR | Status: AC
  Filled 2019-04-15: qty 1

## 2019-04-15 MED ORDER — PROPOFOL 10 MG/ML IV BOLUS
INTRAVENOUS | Status: DC | PRN
  Administered 2019-04-15: 200 mg via INTRAVENOUS

## 2019-04-15 MED ORDER — PROPOFOL 10 MG/ML IV BOLUS
INTRAVENOUS | Status: AC
  Filled 2019-04-15: qty 40

## 2019-04-15 MED ORDER — DEXAMETHASONE SODIUM PHOSPHATE 10 MG/ML IJ SOLN
INTRAMUSCULAR | Status: DC | PRN
  Administered 2019-04-15: 10 mg via INTRAVENOUS

## 2019-04-15 MED ORDER — HYDROMORPHONE HCL 1 MG/ML IJ SOLN
INTRAMUSCULAR | Status: AC
  Filled 2019-04-15: qty 1

## 2019-04-15 MED ORDER — SUGAMMADEX SODIUM 200 MG/2ML IV SOLN
INTRAVENOUS | Status: AC
  Filled 2019-04-15: qty 2

## 2019-04-15 MED ORDER — HYDROMORPHONE HCL 1 MG/ML IJ SOLN
0.2000 mg | INTRAMUSCULAR | Status: DC | PRN
  Administered 2019-04-15 – 2019-04-16 (×2): 0.6 mg via INTRAVENOUS
  Administered 2019-04-16: 0.5 mg via INTRAVENOUS
  Filled 2019-04-15 (×3): qty 1

## 2019-04-15 MED ORDER — FAMOTIDINE 20 MG PO TABS
ORAL_TABLET | ORAL | Status: AC
  Filled 2019-04-15: qty 1

## 2019-04-15 MED ORDER — HEMOSTATIC AGENTS (NO CHARGE) OPTIME
TOPICAL | Status: DC | PRN
  Administered 2019-04-15: 1 via TOPICAL

## 2019-04-15 MED ORDER — BUPIVACAINE HCL 0.5 % IJ SOLN
INTRAMUSCULAR | Status: DC | PRN
  Administered 2019-04-15: 28 mL

## 2019-04-15 MED ORDER — FENTANYL CITRATE (PF) 250 MCG/5ML IJ SOLN
INTRAMUSCULAR | Status: AC
  Filled 2019-04-15: qty 5

## 2019-04-15 MED ORDER — ONDANSETRON HCL 4 MG/2ML IJ SOLN
INTRAMUSCULAR | Status: AC
  Filled 2019-04-15: qty 2

## 2019-04-15 MED ORDER — DEXMEDETOMIDINE HCL IN NACL 80 MCG/20ML IV SOLN
INTRAVENOUS | Status: AC
  Filled 2019-04-15: qty 20

## 2019-04-15 MED ORDER — ONDANSETRON HCL 4 MG/2ML IJ SOLN
INTRAMUSCULAR | Status: DC | PRN
  Administered 2019-04-15: 4 mg via INTRAVENOUS

## 2019-04-15 MED ORDER — SIMETHICONE 80 MG PO CHEW
80.0000 mg | CHEWABLE_TABLET | Freq: Four times a day (QID) | ORAL | Status: DC
  Administered 2019-04-16 (×3): 80 mg via ORAL
  Filled 2019-04-15 (×3): qty 1

## 2019-04-15 MED ORDER — FENTANYL CITRATE (PF) 100 MCG/2ML IJ SOLN
25.0000 ug | INTRAMUSCULAR | Status: DC | PRN
  Administered 2019-04-15 (×5): 25 ug via INTRAVENOUS

## 2019-04-15 MED ORDER — ALBUTEROL SULFATE HFA 108 (90 BASE) MCG/ACT IN AERS
2.0000 | INHALATION_SPRAY | Freq: Four times a day (QID) | RESPIRATORY_TRACT | Status: DC | PRN
  Filled 2019-04-15: qty 6.7

## 2019-04-15 MED ORDER — BUPIVACAINE HCL (PF) 0.5 % IJ SOLN
INTRAMUSCULAR | Status: AC
  Filled 2019-04-15: qty 30

## 2019-04-15 MED ORDER — LIDOCAINE HCL (CARDIAC) PF 100 MG/5ML IV SOSY
PREFILLED_SYRINGE | INTRAVENOUS | Status: DC | PRN
  Administered 2019-04-15: 60 mg via INTRAVENOUS

## 2019-04-15 MED ORDER — OLOPATADINE HCL 0.1 % OP SOLN
1.0000 [drp] | Freq: Every day | OPHTHALMIC | Status: DC | PRN
  Filled 2019-04-15: qty 5

## 2019-04-15 MED ORDER — ROCURONIUM BROMIDE 100 MG/10ML IV SOLN
INTRAVENOUS | Status: DC | PRN
  Administered 2019-04-15: 20 mg via INTRAVENOUS
  Administered 2019-04-15: 50 mg via INTRAVENOUS
  Administered 2019-04-15: 30 mg via INTRAVENOUS
  Administered 2019-04-15: 20 mg via INTRAVENOUS
  Administered 2019-04-15: 30 mg via INTRAVENOUS
  Administered 2019-04-15 (×2): 10 mg via INTRAVENOUS

## 2019-04-15 MED ORDER — GLYCOPYRROLATE 0.2 MG/ML IJ SOLN
INTRAMUSCULAR | Status: DC | PRN
  Administered 2019-04-15: .2 mg via INTRAVENOUS

## 2019-04-15 MED ORDER — IOHEXOL 180 MG/ML  SOLN
INTRAMUSCULAR | Status: DC | PRN
  Administered 2019-04-15: 20 mL

## 2019-04-15 MED ORDER — HYDROMORPHONE HCL 1 MG/ML IJ SOLN
INTRAMUSCULAR | Status: DC | PRN
  Administered 2019-04-15: .4 mg via INTRAVENOUS
  Administered 2019-04-15: .6 mg via INTRAVENOUS

## 2019-04-15 MED ORDER — DOCUSATE SODIUM 100 MG PO CAPS
100.0000 mg | ORAL_CAPSULE | Freq: Two times a day (BID) | ORAL | Status: DC
  Administered 2019-04-16: 10:00:00 100 mg via ORAL
  Filled 2019-04-15 (×2): qty 1

## 2019-04-15 MED ORDER — FENTANYL CITRATE (PF) 100 MCG/2ML IJ SOLN
INTRAMUSCULAR | Status: DC | PRN
  Administered 2019-04-15: 100 ug via INTRAVENOUS
  Administered 2019-04-15 (×6): 25 ug via INTRAVENOUS

## 2019-04-15 MED ORDER — MIDAZOLAM HCL 2 MG/2ML IJ SOLN
INTRAMUSCULAR | Status: DC | PRN
  Administered 2019-04-15: 2 mg via INTRAVENOUS

## 2019-04-15 MED ORDER — CEFAZOLIN SODIUM-DEXTROSE 2-4 GM/100ML-% IV SOLN
INTRAVENOUS | Status: AC
  Filled 2019-04-15: qty 100

## 2019-04-15 MED ORDER — ONDANSETRON HCL 4 MG/2ML IJ SOLN: 4.0000 mg | Freq: Once | INTRAMUSCULAR | Status: DC | PRN

## 2019-04-15 MED ORDER — IBUPROFEN 600 MG PO TABS: 600.0000 mg | ORAL_TABLET | Freq: Four times a day (QID) | ORAL | Status: DC

## 2019-04-15 MED ORDER — FENTANYL CITRATE (PF) 100 MCG/2ML IJ SOLN
INTRAMUSCULAR | Status: AC
  Filled 2019-04-15: qty 2

## 2019-04-15 MED ORDER — PHENYLEPHRINE HCL (PRESSORS) 10 MG/ML IV SOLN
INTRAVENOUS | Status: DC | PRN
  Administered 2019-04-15 (×2): 100 ug via INTRAVENOUS

## 2019-04-15 SURGICAL SUPPLY — 87 items
ADAPTER SCOPE UROLOK II (MISCELLANEOUS) ×2 IMPLANT
ANCHOR TIS RET SYS 1550ML (BAG) ×2 IMPLANT
BAG BILE T-TUBES STRL (MISCELLANEOUS) ×2 IMPLANT
BAG URINE DRAIN 2000ML AR STRL (UROLOGICAL SUPPLIES) ×5 IMPLANT
BASIN GRAD PLASTIC 32OZ STRL (MISCELLANEOUS) ×2 IMPLANT
BLADE SURG SZ11 CARB STEEL (BLADE) ×10 IMPLANT
CANISTER SUCT 1200ML W/VALVE (MISCELLANEOUS) ×5 IMPLANT
CATH FOLEY 2WAY  5CC 16FR (CATHETERS) ×2
CATH FOLEY 2WAY SIL 16X30 (CATHETERS) ×2 IMPLANT
CATH URTH 16FR FL 2W BLN LF (CATHETERS) ×3 IMPLANT
CHLORAPREP W/TINT 26 (MISCELLANEOUS) ×5 IMPLANT
COVER LIGHT HANDLE STERIS (MISCELLANEOUS) ×2 IMPLANT
COVER TIP SHEARS 8 DVNC (MISCELLANEOUS) ×3 IMPLANT
COVER TIP SHEARS 8MM DA VINCI (MISCELLANEOUS) ×2
COVER WAND RF STERILE (DRAPES) ×5 IMPLANT
DEFOGGER SCOPE WARMER CLEARIFY (MISCELLANEOUS) ×5 IMPLANT
DERMABOND ADVANCED (GAUZE/BANDAGES/DRESSINGS) ×2
DERMABOND ADVANCED .7 DNX12 (GAUZE/BANDAGES/DRESSINGS) ×3 IMPLANT
DRAPE 3/4 80X56 (DRAPES) ×15 IMPLANT
DRAPE ARM DVNC X/XI (DISPOSABLE) ×12 IMPLANT
DRAPE COLUMN DVNC XI (DISPOSABLE) ×3 IMPLANT
DRAPE DA VINCI XI ARM (DISPOSABLE) ×8
DRAPE DA VINCI XI COLUMN (DISPOSABLE) ×2
DRAPE LEGGINS SURG 28X43 STRL (DRAPES) ×5 IMPLANT
DRAPE UNDER BUTTOCK W/FLU (DRAPES) ×5 IMPLANT
DRESSING SURGICEL FIBRLLR 1X2 (HEMOSTASIS) IMPLANT
DRIVER NDL 23- D MEGA 49.7X1.4 (INSTRUMENTS) ×3 IMPLANT
DRIVER NDL LRG 8 DVNC XI (INSTRUMENTS) ×3 IMPLANT
DRIVER NDLE LRG 8 DVNC XI (INSTRUMENTS) IMPLANT
DRIVER NEEDLE XI 8MM LRG DVNC (INSTRUMENTS)
DRSG SURGICEL FIBRILLAR 1X2 (HEMOSTASIS) ×5
DRSG TEGADERM 8X12 (GAUZE/BANDAGES/DRESSINGS) ×2 IMPLANT
DRVR NDL 23- D MEGA 49.7X1.4 (INSTRUMENTS)
ELECT REM PT RETURN 9FT ADLT (ELECTROSURGICAL) ×5
ELECTRODE REM PT RTRN 9FT ADLT (ELECTROSURGICAL) ×3 IMPLANT
GAUZE 4X4 16PLY RFD (DISPOSABLE) ×7 IMPLANT
GLOVE BIOGEL PI IND STRL 6.5 (GLOVE) ×3 IMPLANT
GLOVE BIOGEL PI INDICATOR 6.5 (GLOVE) ×2
GLOVE SURG SYN 6.5 ES PF (GLOVE) ×35 IMPLANT
GLOVE SURG SYN 6.5 PF PI (GLOVE) ×12 IMPLANT
GOWN STRL REUS W/ TWL LRG LVL3 (GOWN DISPOSABLE) ×12 IMPLANT
GOWN STRL REUS W/TWL LRG LVL3 (GOWN DISPOSABLE) ×16
GRASPER SUT TROCAR 14GX15 (MISCELLANEOUS) ×4 IMPLANT
GUIDEWIRE STR DUAL SENSOR (WIRE) ×2 IMPLANT
HANDLE YANKAUER SUCT BULB TIP (MISCELLANEOUS) ×4 IMPLANT
IRRIGATION STRYKERFLOW (MISCELLANEOUS) IMPLANT
IRRIGATOR STRYKERFLOW (MISCELLANEOUS) ×5
IV NS 1000ML (IV SOLUTION) ×2
IV NS 1000ML BAXH (IV SOLUTION) IMPLANT
KIT PINK PAD W/HEAD ARE REST (MISCELLANEOUS) ×5
KIT PINK PAD W/HEAD ARM REST (MISCELLANEOUS) ×3 IMPLANT
LABEL OR SOLS (LABEL) ×5 IMPLANT
LAPSAC SURG PACK 8X10 (MISCELLANEOUS) ×5
MANIPULATOR VCARE LG CRV RETR (MISCELLANEOUS) IMPLANT
MANIPULATOR VCARE SML CRV RETR (MISCELLANEOUS) IMPLANT
MANIPULATOR VCARE STD CRV RETR (MISCELLANEOUS) ×2 IMPLANT
NEEDLE HYPO 22GX1.5 SAFETY (NEEDLE) ×5 IMPLANT
NEEDLE VERESS 14GA 120MM (NEEDLE) ×3 IMPLANT
NS IRRIG 1000ML POUR BTL (IV SOLUTION) ×5 IMPLANT
OBTURATOR OPTICAL STANDARD 8MM (TROCAR) ×2
OBTURATOR OPTICAL STND 8 DVNC (TROCAR) ×3
OBTURATOR OPTICALSTD 8 DVNC (TROCAR) ×3 IMPLANT
OCCLUDER COLPOPNEUMO (BALLOONS) ×4 IMPLANT
PACK LAP CHOLECYSTECTOMY (MISCELLANEOUS) ×5 IMPLANT
PACK SURG LAPSAC 8X10 (MISCELLANEOUS) IMPLANT
PAD OB MATERNITY 4.3X12.25 (PERSONAL CARE ITEMS) ×5 IMPLANT
PAD PREP 24X41 OB/GYN DISP (PERSONAL CARE ITEMS) ×5 IMPLANT
PENCIL ELECTRO HAND CTR (MISCELLANEOUS) ×5 IMPLANT
SEAL CANN UNIV 5-8 DVNC XI (MISCELLANEOUS) ×9 IMPLANT
SEAL XI 5MM-8MM UNIVERSAL (MISCELLANEOUS) ×6
SEALER VESSEL DA VINCI XI (MISCELLANEOUS)
SEALER VESSEL EXT DVNC XI (MISCELLANEOUS) IMPLANT
SET CYSTO W/LG BORE CLAMP LF (SET/KITS/TRAYS/PACK) ×7 IMPLANT
SOLUTION ELECTROLUBE (MISCELLANEOUS) ×5 IMPLANT
STENT URET 6FRX24 CONTOUR (STENTS) ×2 IMPLANT
SURGILUBE 2OZ TUBE FLIPTOP (MISCELLANEOUS) ×5 IMPLANT
SUT CUT NEEDLE DRIVER (INSTRUMENTS)
SUT DVC VLOC 180 0 12IN GS21 (SUTURE)
SUT MNCRL 4-0 (SUTURE) ×4
SUT MNCRL 4-0 27XMFL (SUTURE) ×6
SUT VIC AB 0 CT1 36 (SUTURE) ×5 IMPLANT
SUTURE DVC VLC 180 0 12IN GS21 (SUTURE) IMPLANT
SUTURE MNCRL 4-0 27XMF (SUTURE) ×3 IMPLANT
SYR 10ML LL (SYRINGE) ×5 IMPLANT
TAPE TRANSPORE STRL 2 31045 (GAUZE/BANDAGES/DRESSINGS) ×2 IMPLANT
TOWEL OR 17X26 4PK STRL BLUE (TOWEL DISPOSABLE) ×2 IMPLANT
TUBING EVAC SMOKE HEATED PNEUM (TUBING) IMPLANT

## 2019-04-15 NOTE — CV Procedure (Signed)
Operative Note   Date 04/15/2019 TIME 12:48 PM  PRE-OP DIAGNOSIS: Symptomatic leiomyoma    POST-OP DIAGNOSIS: Symptomatic leiomyoma  SURGEON: Surgeon(s) and Role: Panel 1:  Enrrique Mierzwa Gaetana Michaelis, MD  ASSISTANT:  Adrian Prows, MD  ANESTHESIA: General  PROCEDURE: Procedure(s):Robotic assisted total hysterectomy, left oophorectomy, and bilateral salpingectomy with contained manual morcellation within a laparoscopic bag  ESTIMATED BLOOD LOSS: 50 cc   DRAINS: Foley  TOTAL IV FLUIDS: 2700 cc  UOP: approximately 300 cc  SPECIMENS:  Uterus,cervix, left ovary, and bilateral fallopian tubes   COMPLICATIONS: None  DISPOSITION: PACU  CONDITION: Stable  INDICATIONS: Symptomatic leiomyoma  FINDINGS: Exam under anesthesia revealed an enlarged 15-16 week mobile globular and irregular uterus. There were no adnexal masses or nodularity. The parametria was smooth. The cervix was negative for gross lesions. Intraoperative findings included: The uterus was enlarged and enlongated to 16 cm with multiple leiomyoma. The tubes were notable for bilateral hydrosalpinx. She had a left ovarian cyst and the left ovary/tube were densely adherent to the lateral/posterior uterus. The right ovary was adherent to the right tube but otherwise normal.  The upper abdomen was normal including omentum, bowel, liver, stomach, and diaphragmatic surfaces. There was no evidence of grossly enlarged pelvic or right para-aortic lymph nodes.   PROCEDURE IN DETAIL: After informed consent was obtained, the patient was taken to the operating room where anesthesia was obtained without difficulty. The patient was positioned in the dorsal lithotomy position in [place] and her arms were carefully tucked at her sides and the usual precautions were taken.  She was prepped and draped in normal sterile fashion.  Time-out was performed. Dr. Erlene Quan placed the Foley catheter and bilateral stents as per her operative  note. After her portion of the procedure the patient was reprepped and redraped in normal sterile fashion. A speculum was placed in the vagina and the cervical os was dilator. The uterus sounded to 7 cm.  A standard VCare uterine manipulator was then placed in the uterus without incident.    Operative entry was obtained via a LUQ incision and direct entry with visualization. The abdomen insuffulated, and pelvis visualized with noted findings above.  The patient was placed in Trendelenburg and the bowel was displaced up into the upper abdomen.  The four robotic ports and were placed, and robotic docking was performed. Round ligaments were divided on each side and the retroperitoneal space was opened bilaterally. The left infundibulopelvic ligament was skeletonized, and the ureter identified and preserved.  The left infundibulopelvic ligaments was sealed and divided. On the right the tube was dissected off the ovary. The right utero-ovarian ligament was sealed and divided.  A bladder flap was created and the bladder was dissected down off the lower uterine segment and cervix. The fibroids uterus was manipulated to provide adequate exposure and the uterine arteries were skeletonized bilaterally, sealed and divided. On the left the leiomyoma extended more inferiorly and laterally approximately 1.5 cm from the ureter.  A colpotomy was performed circumferentially along the V-Care ring with electrocautery and the cervix was incised from the vagina. The V-care was removed. The specimen was placed in a bag and brought through the vagina. Contained manual morcellations was performed within the laparoscopic bag and removed completely.  A pneumo balloon was placed in the vagina and the vaginal cuff was then closed in a running continuous fashion using 0 V-Lock suture with careful attention to include the vaginal cuff angles and the vaginal mucosa within the closure.  Hemostasis was observed. The intraperitoneal pressure  was dropped, and all planes of dissection, vascular pedicles and the vaginal cuff were found to be hemostatic.  Fibrillar was placed on raw surfaces. The umbilical trocar was removed and the fascia of the umbilical port was closed with 0 Vicryl suture using the Donnamarie Rossetti. The remaining trocars were removed and all noted to be hemostatic. The skin incisions were closed with subcuticular stitch and Indermil glue.    Dr. Erlene Quan removed the bilateral stents and replaced the left ureteral for prophylaxis. The cystoscopy was negative.   The patient tolerated the procedure well.  Sponge, lap and needle counts were correct x2.  The patient was taken to recovery room in excellent condition.  Antibiotics: Given 1st or 2nd generation cephalosporin, Antibiotics given within 1 hour of the start of the procedure, Antibiotics ordered to be discontinued within 24 hours post procedure   VTE prophylaxis: was ordered perioperatively.   Dr.Schuman assisted me with the procedure which could not have been performed without her assistance.   Gillis Ends, MD

## 2019-04-15 NOTE — Transfer of Care (Signed)
Immediate Anesthesia Transfer of Care Note  Patient: Tiffany Rodriguez  Procedure(s) Performed: XI ROBOTIC ASSISTED LAPAROSCOPIC TOTAL HYSTERECTOMY WITH BILATERAL SALPINGECTOMY ,LEFT  OOPHORECTOMY (N/A Abdomen) CYSTOSCOPY WITH RETROGRADE PYELOGRAM/URETERAL STENT PLACEMENT (Left Ureter)  Patient Location: PACU  Anesthesia Type:General  Level of Consciousness: drowsy and patient cooperative  Airway & Oxygen Therapy: Patient Spontanous Breathing and Patient connected to face mask oxygen  Post-op Assessment: Report given to RN and Post -op Vital signs reviewed and stable  Post vital signs: Reviewed and stable  Last Vitals:  Vitals Value Taken Time  BP 146/91 04/15/19 1837  Temp 36.7 C 04/15/19 1837  Pulse 100 04/15/19 1842  Resp 18 04/15/19 1841  SpO2 99 % 04/15/19 1842  Vitals shown include unvalidated device data.  Last Pain:  Vitals:   04/15/19 1837  TempSrc:   PainSc: (P) Asleep         Complications: No apparent anesthesia complications

## 2019-04-15 NOTE — Anesthesia Postprocedure Evaluation (Signed)
Anesthesia Post Note  Patient: Tiffany Rodriguez  Procedure(s) Performed: XI ROBOTIC ASSISTED LAPAROSCOPIC TOTAL HYSTERECTOMY WITH BILATERAL SALPINGECTOMY ,LEFT  OOPHORECTOMY (N/A Abdomen) CYSTOSCOPY WITH RETROGRADE PYELOGRAM/URETERAL STENT PLACEMENT (Left Ureter)  Patient location during evaluation: PACU Anesthesia Type: General Level of consciousness: awake and alert Pain management: pain level controlled Vital Signs Assessment: post-procedure vital signs reviewed and stable Respiratory status: spontaneous breathing, nonlabored ventilation and respiratory function stable Cardiovascular status: blood pressure returned to baseline and stable Postop Assessment: no apparent nausea or vomiting Anesthetic complications: no     Last Vitals:  Vitals:   04/15/19 2220 04/15/19 2301  BP: (!) 154/88 (!) 163/90  Pulse: 70 70  Resp: 20 20  Temp: 37 C 37.2 C  SpO2: 99% 98%    Last Pain:  Vitals:   04/15/19 2330  TempSrc:   PainSc: 9                  Brett Canales Georgeann Brinkman

## 2019-04-15 NOTE — Anesthesia Procedure Notes (Signed)
Procedure Name: Intubation Date/Time: 04/15/2019 9:02 AM Performed by: Gentry Fitz, CRNA Pre-anesthesia Checklist: Patient identified, Emergency Drugs available, Suction available and Patient being monitored Patient Re-evaluated:Patient Re-evaluated prior to induction Oxygen Delivery Method: Circle system utilized Preoxygenation: Pre-oxygenation with 100% oxygen Induction Type: IV induction Ventilation: Mask ventilation without difficulty Laryngoscope Size: Mac and 4 Grade View: Grade III Tube type: Oral Tube size: 7.0 mm Number of attempts: 1 Airway Equipment and Method: Stylet Placement Confirmation: ETT inserted through vocal cords under direct vision,  positive ETCO2 and breath sounds checked- equal and bilateral Secured at: 24 cm Tube secured with: Tape Dental Injury: Teeth and Oropharynx as per pre-operative assessment

## 2019-04-15 NOTE — Consult Note (Signed)
Tiffany Rodriguez has no significant changes since she was last seen. Dr. Gilman Schmidt last saw her on 04/06/2019.  Her labs are acceptable for surgery. bHCG negative. Consent is signed. We will proceed with surgery as planned. Dr. Gilman Schmidt will discuss with the Urology team.  [facility] Gaetana Michaelis, MD

## 2019-04-15 NOTE — Op Note (Addendum)
Date of procedure: 04/15/19  Preoperative diagnosis:  1. Abnormal uterine bleeding 2. Uterine fibroids  Postoperative diagnosis:  1. Same as above  Procedure: 1. Cystoscopy 2. Bilateral retrograde pyelogram 3. Ureteral stent placement  Surgeon: Hollice Espy, MD  Anesthesia: General  Complications: None  Intraoperative findings: Normal retrograde pyelograms bilaterally without hydronephrosis, filling defects or ureteral deviation.  Open-ended ureteral catheters placed x2 without difficulty atraumatically.  Secured the Foley.  EBL: Minimal  Specimens: None  Drains: 5 French open-ended ureteral catheter x2, 16 French Foley catheter  Indication: Tiffany Rodriguez is a 49 y.o. patient with abnormal uterine bleeding/uterine fibroids undergoing robotic hysterectomy with bilateral salpingectomy.  Dr Vladimir Creeks have recommended that the patient have preoperative ureteral stents to help with ureteral identification intraoperatively to the complexity of the procedure..  After reviewing the management options for treatment,s he elected to proceed with the above surgical procedure(s). We have discussed the potential benefits and risks of the procedure, side effects of the proposed treatment, the likelihood of the patient achieving the goals of the procedure, and any potential problems that might occur during the procedure or recuperation. Informed consent has been obtained.  Description of procedure:  The patient was taken to the operating room and general anesthesia was induced.  The patient was placed in the dorsal lithotomy position, prepped and draped in the usual sterile fashion, and preoperative antibiotics were administered. A preoperative time-out was performed.   A 21 French scope was advanced per urethra into the bladder.  The bladder itself was relatively unremarkable.   Attention was turned to the right ureteral orifice.  This was intubated using a 5 [place] open-ended ureteral  catheter.  Gentle retrograde pyelogram was performed on the side which showed no hydroureteronephrosis or filling defects.  The upper tract system was decompressed without hydronephrosis.  There is no distal ureteral deviation.  A sensor wire was then placed up to the level of the kidney and a 5 [place] open-ended ureteral catheter was advanced into the renal pelvis.  The wire was withdrawn.  The scope was then removed leaving this open-ended ureteral catheter in place.  The scope was readvanced alongside of this catheter back into the bladder.  Attention was turned this time to the left ureteral orifice which was cannulated using a 5 [place] open-ended ureteral catheter.  Retrograde pyelogram was performed on the side as well which was also unremarkable without hydronephrosis or filling defects or ureteral deviation or stricture.  The wire was placed up to the level of the renal pelvis.  The open-ended ureteral catheter was advanced into the renal pelvis again on this side.  The wire was withdrawn and the scope was removed.  The distal aspect of the stent was cut at an angle to identify that this was the left ureteral stent.  A 16 French Foley catheter was then placed and a connector device was used to attach the open-ended ureteral catheters to the Foley in Foley bag.  Patient was then repositioned onto the robotic bed for remainder of the procedure.  Per my discussion with Dr. Nechama Guard, they may elect to remove the open-ended ureteral catheters at the end of the procedure if it goes without complication there is no concern for ureteral injury.  If they would like conversion of the stents to double-J ureteral stents, were happy to return to do this.  Stents can be removed at the end of the procedure as if uncomplicated.  Hollice Espy, M.D.    Return to the operating room  around 5:30 PM.  This point in time, they have completed morcellating the uterus.  Urine was relatively bloody.  In discussion with Dr.  Theora Gianotti, there was some concern about compression of the bladder throughout the procedure as well as dissection in and around the left distal ureter where thermal injury was utilized.  After the robot was redocked, the bladder itself appeared to be intact at the dome.  There was in fact some thermal cautery very close to the left distal ureter although the ureter itself appeared to be grossly intact.  There was no concern for right ureteral disruption or injury.  As a precaution, the Foley catheter was moved and a flexible cystoscope was introduced into the bladder.  There is a mild amount of erythema in the posterior bladder wall presumably related to the catheter but otherwise the bladder itself was intact and filled nicely.  There is no extravasation of the irrigant fluid.  At this point in time, the left ureteral open-ended ureteral catheter was identified and transected sterilely.  It was cannulate using a sensor wire presumably up to level of the kidney which advanced easily.  The ureteral catheter was then removed and a 6 x 24 [place] was advanced over the wire quite easily to level the urethral meatus.  The wire was then withdrawn.  There is no resistance or concern thus the left ureteral stent was presumably placed in the correct anatomic position without difficulty.  Transabdominally, manipulation of the left ureter could be seen during the time of stent placement with advancement of the stent.  The 5 French right-sided open-ended ureteral catheter was removed without difficulty.  A new sterile 70 French Foley catheter was placed in the balloon was filled with 10 cc of sterile water.  Plan for KUB in PACU to check stent position.  She will need outpatient cystoscopy, stent removal in about a week.  We will make these arrangements.

## 2019-04-15 NOTE — Interval H&P Note (Signed)
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  The various methods of treatment have been discussed with the patient and family. After consideration of risks, benefits and other options for treatment, the patient has consented to  Procedure(s): XI ROBOTIC ASSISTED LAPAROSCOPIC TOTAL HYSTERECTOMY WITH BILATERAL SALPINGO possible OOPHORECTOMY (N/A) CYSTOSCOPY (N/A) as a surgical intervention.  The patient's history has been reviewed, patient examined, no change in status, stable for surgery.  I have reviewed the patient's chart and labs.  Questions were answered to the patient's satisfaction.    Asked to place preoperative ureteral stent x2 for this patient today to help with ureteral identification intraoperatively  Procedure was discussed at length with the patient including risk of bleeding, infection, damage surrounding structures, ureteral injury/edema amongst others.  All questions answered.  Consent signed.  Hollice Espy

## 2019-04-15 NOTE — OR Nursing (Signed)
Called Pt Family with update. Will continue to monitor.  Margarite Gouge RN BSN

## 2019-04-15 NOTE — Telephone Encounter (Signed)
Intraoperative left double-J ureteral stent was placed as a precaution.  She will need follow-up for cystoscopy, stent removal next week in the office.  Please schedule.  Hollice Espy, MD

## 2019-04-15 NOTE — Anesthesia Preprocedure Evaluation (Signed)
Anesthesia Evaluation  Patient identified by MRN, date of birth, ID band Patient awake    Reviewed: Allergy & Precautions, H&P , NPO status , Patient's Chart, lab work & pertinent test results, reviewed documented beta blocker date and time   Airway Mallampati: II  TM Distance: >3 FB Neck ROM: full    Dental  (+) Teeth Intact   Pulmonary asthma , Current Smoker and Patient abstained from smoking.,    Pulmonary exam normal        Cardiovascular Exercise Tolerance: Good negative cardio ROS Normal cardiovascular exam Rhythm:regular Rate:Normal     Neuro/Psych negative neurological ROS  negative psych ROS   GI/Hepatic negative GI ROS, Neg liver ROS,   Endo/Other  negative endocrine ROS  Renal/GU negative Renal ROS  negative genitourinary   Musculoskeletal   Abdominal   Peds  Hematology  (+) Blood dyscrasia, anemia ,   Anesthesia Other Findings Past Medical History: No date: Anemia No date: Arthritis     Comment:  right knee No date: Asthma Past Surgical History: 2006: DILATION AND CURETTAGE OF UTERUS No date: KNEE ARTHROSCOPY; Right BMI    Body Mass Index: 34.76 kg/m     Reproductive/Obstetrics negative OB ROS                             Anesthesia Physical Anesthesia Plan  ASA: II  Anesthesia Plan: General ETT   Post-op Pain Management:    Induction:   PONV Risk Score and Plan:   Airway Management Planned:   Additional Equipment:   Intra-op Plan:   Post-operative Plan:   Informed Consent: I have reviewed the patients History and Physical, chart, labs and discussed the procedure including the risks, benefits and alternatives for the proposed anesthesia with the patient or authorized representative who has indicated his/her understanding and acceptance.     Dental Advisory Given  Plan Discussed with: CRNA  Anesthesia Plan Comments:         Anesthesia Quick  Evaluation

## 2019-04-15 NOTE — Discharge Instructions (Signed)
Laparoscopically Assisted Vaginal Hysterectomy, Care After This sheet gives you information about how to care for yourself after your procedure. Your health care provider may also give you more specific instructions. If you have problems or questions, contact your health care provider. What can I expect after the procedure? After the procedure, it is common to have:  Soreness and numbness in your incision areas.  Abdominal pain. You will be given pain medicine to control it.  Vaginal bleeding and discharge. You will need to use a sanitary napkin after this procedure.  Sore throat from the breathing tube that was inserted during surgery. Follow these instructions at home: Medicines  Take over-the-counter and prescription medicines only as told by your health care provider.  Do not take aspirin or ibuprofen. These medicines can cause bleeding.  Do not drive or use heavy machinery while taking prescription pain medicine.  Do not drive for 24 hours if you were given a medicine to help you relax (sedative) during the procedure. Incision care   Follow instructions from your health care provider about how to take care of your incisions. Make sure you: ? Wash your hands with soap and water before you change your bandage (dressing). If soap and water are not available, use hand sanitizer. ? Change your dressing as told by your health care provider. ? Leave stitches (sutures), skin glue, or adhesive strips in place. These skin closures may need to stay in place for 2 weeks or longer. If adhesive strip edges start to loosen and curl up, you may trim the loose edges. Do not remove adhesive strips completely unless your health care provider tells you to do that.  Check your incision area every day for signs of infection. Check for: ? Redness, swelling, or pain. ? Fluid or blood. ? Warmth. ? Pus or a bad smell. Activity  Get regular exercise as told by your health care provider. You may be  told to take short walks every day and go farther each time.  Return to your normal activities as told by your health care provider. Ask your health care provider what activities are safe for you.  Do not douche, use tampons, or have sexual intercourse for at least 6 weeks, or until your health care provider gives you permission.  Do not lift anything that is heavier than 10 lb (4.5 kg), or the limit that your health care provider tells you, until he or she says that it is safe. General instructions  Do not take baths, swim, or use a hot tub until your health care provider approves. Take showers instead of baths.  Do not drive for 24 hours if you received a sedative.  Do not drive or operate heavy machinery while taking prescription pain medicine.  To prevent or treat constipation while you are taking prescription pain medicine, your health care provider may recommend that you: ? Drink enough fluid to keep your urine clear or pale yellow. ? Take over-the-counter or prescription medicines. ? Eat foods that are high in fiber, such as fresh fruits and vegetables, whole grains, and beans. ? Limit foods that are high in fat and processed sugars, such as fried and sweet foods.  Keep all follow-up visits as told by your health care provider. This is important. Contact a health care provider if:  You have signs of infection, such as: ? Redness, swelling, or pain around your incision sites. ? Fluid or blood coming from an incision. ? An incision that feels warm to the   touch. ? Pus or a bad smell coming from an incision.  Your incision breaks open.  Your pain medicine is not helping.  You feel dizzy or light-headed.  You have pain or bleeding when you urinate.  You have persistent nausea and vomiting.  You have blood, pus, or a bad-smelling discharge from your vagina. Get help right away if:  You have a fever.  You have severe abdominal pain.  You have chest pain.  You have  shortness of breath.  You faint.  You have pain, swelling, or redness in your leg.  You have heavy bleeding from your vagina. Summary  After the procedure, it is common to have abdominal pain and vaginal bleeding.  You should not drive or lift heavy objects until your health care provider says that it is safe.  Contact your health care provider if you have any symptoms of infection, excessive vaginal bleeding, nausea, vomiting, or shortness of breath. This information is not intended to replace advice given to you by your health care provider. Make sure you discuss any questions you have with your health care provider. Document Revised: 02/22/2017 Document Reviewed: 05/08/2016 [employer]vier Patient Education  2020 [employer]  

## 2019-04-16 DIAGNOSIS — Z90721 Acquired absence of ovaries, unilateral: Secondary | ICD-10-CM

## 2019-04-16 DIAGNOSIS — Z90710 Acquired absence of both cervix and uterus: Secondary | ICD-10-CM

## 2019-04-16 DIAGNOSIS — D259 Leiomyoma of uterus, unspecified: Secondary | ICD-10-CM | POA: Diagnosis not present

## 2019-04-16 LAB — CBC
HCT: 35.8 % — ABNORMAL LOW (ref 36.0–46.0)
Hemoglobin: 10.7 g/dL — ABNORMAL LOW (ref 12.0–15.0)
MCH: 24.8 pg — ABNORMAL LOW (ref 26.0–34.0)
MCHC: 29.9 g/dL — ABNORMAL LOW (ref 30.0–36.0)
MCV: 83.1 fL (ref 80.0–100.0)
Platelets: 191 10*3/uL (ref 150–400)
RBC: 4.31 MIL/uL (ref 3.87–5.11)
RDW: 20.4 % — ABNORMAL HIGH (ref 11.5–15.5)
WBC: 6.4 10*3/uL (ref 4.0–10.5)
nRBC: 0 % (ref 0.0–0.2)

## 2019-04-16 MED ORDER — IBUPROFEN 600 MG PO TABS: 600.0000 mg | ORAL_TABLET | Freq: Four times a day (QID) | ORAL | 3 refills | Status: DC

## 2019-04-16 MED ORDER — IBUPROFEN 800 MG PO TABS: 800.0000 mg | ORAL_TABLET | Freq: Three times a day (TID) | ORAL | 6 refills | Status: AC | PRN

## 2019-04-16 MED ORDER — METOCLOPRAMIDE HCL 10 MG PO TABS: 10.0000 mg | ORAL_TABLET | Freq: Three times a day (TID) | ORAL | 1 refills | Status: DC

## 2019-04-16 MED ORDER — AMMONIA AROMATIC IN INHA
RESPIRATORY_TRACT | Status: AC
  Filled 2019-04-16: qty 10

## 2019-04-16 MED ORDER — OXYCODONE HCL 5 MG PO TABS: 5.0000 mg | ORAL_TABLET | ORAL | 0 refills | Status: DC | PRN

## 2019-04-16 MED ORDER — ACETAMINOPHEN 500 MG PO TABS
1000.0000 mg | ORAL_TABLET | Freq: Four times a day (QID) | ORAL | Status: DC
  Administered 2019-04-16 (×2): 1000 mg via ORAL
  Filled 2019-04-16 (×2): qty 2

## 2019-04-16 MED ORDER — ONDANSETRON HCL 4 MG PO TABS: 4.0000 mg | ORAL_TABLET | Freq: Four times a day (QID) | ORAL | 0 refills | Status: DC | PRN

## 2019-04-16 MED ORDER — DOCUSATE SODIUM 100 MG PO CAPS: 100.0000 mg | ORAL_CAPSULE | Freq: Two times a day (BID) | ORAL | 0 refills | Status: DC

## 2019-04-16 MED ORDER — ACETAMINOPHEN 500 MG PO TABS: 1000.0000 mg | ORAL_TABLET | Freq: Four times a day (QID) | ORAL | 0 refills | Status: DC

## 2019-04-16 NOTE — Discharge Summary (Signed)
Physician Discharge Summary   Patient ID: Tiffany Rodriguez [id number] 49 y.o. 01/16/1971  Admit date: 04/15/2019  Discharge date and time: No discharge date for patient encounter.   Admitting Physician: Homero Fellers, MD   Discharge Physician: Adrian Prows MD  Admission Diagnoses: S/P hysterectomy with oophorectomy [Z90.710, Z90.721]  Discharge Diagnoses: same as above  Admission Condition: good  Discharged Condition: good  Indication for Admission: Recovery after prolonged surgery  Hospital Course: patient had a normal uncomplicated post operative course. She was able to ambulate, tolerate a regular diet, and urinate. Her pain was controlled on oral medication.   Consults: None  Significant Diagnostic Studies:   Treatments: IV hydration  Discharge Exam: BP (!) 155/94   Pulse 80   Temp 98.4 F (36.9 C) (Axillary)   Resp 20   Ht 5\' 3"  (1.6 m)   Wt 89 kg   LMP 03/20/2019 (Exact Date)   SpO2 98%   BMI 34.76 kg/m   General Appearance:    Alert, cooperative, no distress, appears stated age  Head:    Normocephalic, without obvious abnormality, atraumatic  Eyes:    PERRL, conjunctiva/corneas clear, EOM's intact, fundi    benign, both eyes  Ears:    Normal TM's and external ear canals, both ears  Nose:   Nares normal, septum midline, mucosa normal, no drainage    or sinus tenderness  Throat:   Lips, mucosa, and tongue normal; teeth and gums normal  Neck:   Supple, symmetrical, trachea midline, no adenopathy;    thyroid:  no enlargement/tenderness/nodules; no carotid   bruit or JVD  Back:     Symmetric, no curvature, ROM normal, no CVA tenderness  Lungs:     Clear to auscultation bilaterally, respirations unlabored  Chest Wall:    No tenderness or deformity   Heart:    Regular rate and rhythm, S1 and S2 normal, no murmur, rub   or gallop  Breast Exam:    No tenderness, masses, or nipple abnormality  Abdomen:     Soft, non-tender, bowel sounds active  all four quadrants,    no masses, no organomegaly  Genitalia:    Normal female without lesion, discharge or tenderness  Rectal:    Normal tone, normal prostate, no masses or tenderness;   guaiac negative stool  Extremities:   Extremities normal, atraumatic, no cyanosis or edema  Pulses:   2+ and symmetric all extremities  Skin:   Skin color, texture, turgor normal, no rashes or lesions  Lymph nodes:   Cervical, supraclavicular, and axillary nodes normal  Neurologic:   CNII-XII intact, normal strength, sensation and reflexes    throughout    Disposition: Discharge disposition: 01-Home or Self Care       Patient Instructions:  Allergies as of 04/16/2019   No Known Allergies     Medication List    STOP taking these medications   medroxyPROGESTERone 10 MG tablet Commonly known as: PROVERA   tranexamic acid 650 MG Tabs tablet Commonly known as: LYSTEDA     TAKE these medications   acetaminophen 500 MG tablet Commonly known as: TYLENOL Take 2 tablets (1,000 mg total) by mouth every 6 (six) hours.   albuterol 108 (90 Base) MCG/ACT inhaler Commonly known as: VENTOLIN HFA Inhale 2 puffs into the lungs every 6 (six) hours as needed for wheezing or shortness of breath.   docusate sodium 100 MG capsule Commonly known as: COLACE Take 1 capsule (100 mg total) by mouth 2 (two) times daily.  ferumoxytol 510 MG/17ML Soln injection Commonly known as: FERAHEME Inject 17 mLs (510 mg total) into the vein as directed. Infuse 510mg  Feraheme IV over at least 15 minutes at day 0 and repeat 3 to 8 days later. Dilute full contents of vial (25ml) per product insert instructions before use. 2 vials(70ml)   ibuprofen 800 MG tablet Commonly known as: ADVIL Take 1 tablet (800 mg total) by mouth every 8 (eight) hours as needed for cramping. What changed:   medication strength  how much to take  when to take this  reasons to take this   ibuprofen 600 MG tablet Commonly known as:  ADVIL Take 1 tablet (600 mg total) by mouth every 6 (six) hours. What changed: You were already taking a medication with the same name, and this prescription was added. Make sure you understand how and when to take each.   metoCLOPramide 10 MG tablet Commonly known as: REGLAN Take 1 tablet (10 mg total) by mouth 3 (three) times daily with meals.   ondansetron 4 MG tablet Commonly known as: ZOFRAN Take 1 tablet (4 mg total) by mouth every 6 (six) hours as needed for nausea.   oxyCODONE 5 MG immediate release tablet Commonly known as: Oxy IR/ROXICODONE Take 1-2 tablets (5-10 mg total) by mouth every 4 (four) hours as needed for moderate pain.   Pataday 0.1 % ophthalmic solution Generic drug: olopatadine Place 1 drop into both eyes daily as needed for allergies.      Activity: activity as tolerated Diet: regular diet Wound Care: none needed  Follow-up with Dr. Gilman Schmidt in 2 weeks.  Signed: Homero Fellers 04/16/2019 6:30 PM

## 2019-04-16 NOTE — Progress Notes (Signed)
Patient discharged home. Discharge instructions reviewed and given to pt. Pt verbalized understanding and had no further questions. Escorted out by staff.

## 2019-04-16 NOTE — Progress Notes (Signed)
Tiffany Rodriguez is a 49 y.o. female patient.  She is resting in bed, doing well. Ordered breakfast and feeling hungry. Pain controlled. Minimal vaginal bleeding.   1. Surgery, elective   2. S/P cystoscopy with ureteral stent placement     Past Medical History:  Diagnosis Date  . Anemia   . Arthritis    right knee  . Asthma     Current Facility-Administered Medications  Medication Dose Route Frequency Provider Last Rate Last Admin  . acetaminophen (TYLENOL) tablet 1,000 mg  1,000 mg Oral Q6H ,  R, MD   1,000 mg at 04/16/19 1001  . albuterol (VENTOLIN HFA) 108 (90 Base) MCG/ACT inhaler 2 puff  2 puff Inhalation Q6H PRN ,  R, MD      . bisacodyl (DULCOLAX) suppository 10 mg  10 mg Rectal Daily PRN ,  R, MD      . docusate sodium (COLACE) capsule 100 mg  100 mg Oral BID ,  R, MD   100 mg at 04/16/19 1001  . HYDROmorphone (DILAUDID) injection 0.2-0.6 mg  0.2-0.6 mg Intravenous Q2H PRN ,  R, MD   0.6 mg at 04/16/19 0800  . ketorolac (TORADOL) 30 MG/ML injection 30 mg  30 mg Intravenous Q6H ,  R, MD   30 mg at 04/16/19 1157   Followed by  . [START ON 04/17/2019] ibuprofen (ADVIL) tablet 600 mg  600 mg Oral Q6H ,  R, MD      . lactated ringers infusion   Intravenous Continuous Homero Fellers, MD 125 mL/hr at 04/16/19 0410 Rate Verify at 04/16/19 0410  . olopatadine (PATANOL) 0.1 % ophthalmic solution 1 drop  1 drop Both Eyes Daily PRN ,  R, MD      . ondansetron (ZOFRAN) tablet 4 mg  4 mg Oral Q6H PRN ,  R, MD       Or  . ondansetron (ZOFRAN) injection 4 mg  4 mg Intravenous Q6H PRN ,  R, MD      . oxyCODONE (Oxy IR/ROXICODONE) immediate release tablet 5-10 mg  5-10 mg Oral Q4H PRN ,  R, MD   10 mg at 04/16/19 1001  . polyethylene glycol (MIRALAX / GLYCOLAX) packet 17 g  17 g Oral Daily PRN ,   R, MD      . simethicone (MYLICON) chewable tablet 80 mg  80 mg Oral QID ,  R, MD   80 mg at 04/16/19 1001   No Known Allergies Active Problems:   Leiomyoma   S/P hysterectomy with oophorectomy  Blood pressure 139/90, pulse 69, temperature 98.8 F (37.1 C), temperature source Oral, resp. rate 18, height 5\' 3"  (1.6 m), weight 89 kg, last menstrual period 03/20/2019, SpO2 97 %.  Review of Systems  Constitutional: Negative for chills and fever.  HENT: Negative for congestion, hearing loss and sinus pain.   Respiratory: Negative for cough, shortness of breath and wheezing.   Cardiovascular: Negative for chest pain, palpitations and leg swelling.  Gastrointestinal: Negative for abdominal pain, constipation, diarrhea, nausea and vomiting.  Genitourinary: Negative for dysuria, flank pain, frequency, hematuria and urgency.  Musculoskeletal: Negative for back pain.  Skin: Negative for rash.  Neurological: Negative for dizziness and headaches.  Psychiatric/Behavioral: Negative for suicidal ideas. The patient is not nervous/anxious.     Physical Exam Vitals and nursing note reviewed.  Constitutional:      Appearance: She is well-developed.  HENT:     Head: Normocephalic and atraumatic.  Cardiovascular:  Rate and Rhythm: Normal rate and regular rhythm.  Pulmonary:     Effort: Pulmonary effort is normal.     Breath sounds: Normal breath sounds.  Abdominal:     General: Abdomen is flat. Bowel sounds are normal.     Palpations: Abdomen is soft.     Comments: Incisions clean, dry, intact  Musculoskeletal:        General: Normal range of motion.  Skin:    General: Skin is warm and dry.  Neurological:     Mental Status: She is alert and oriented to person, place, and time.  Psychiatric:        Behavior: Behavior normal.        Thought Content: Thought content normal.        Judgment: Judgment normal.    A/P 49 yo s/p robotic assisted total  hysterectomy, bilateral salpingectomy and left oophorectomy, cystoscopy and ureteral stent placement. POD#1  1. Continue regular diet 2. Encouraged ambulation 3. Discussed care of incisions 4. Foley in place- will need to maintain stent x1 week and follow up with urology for removal.  5. Plan for discharge home this evening.    R  04/16/2019

## 2019-04-16 NOTE — Progress Notes (Signed)
Monna Shiels was seen and treated in our Hospital on 04/15/2019-04/16/2019. Please excuse Marianna Fuss from work, as he was her support person    Treatment Team:  Attending Provider: Homero Fellers, MD

## 2019-04-16 NOTE — Plan of Care (Signed)
Vs stable; moving in bed well; taking dilaudid iv, toradol iv and tylenol for pain control; pt encouraged when she's tolerated some food to try roxycodone for pain control; pt ok to try real food for breakfast; foley catheter to remain in per MD order; pt had cystoscopy and left ureteral stent placed yesterday during her hysterectomy surgery; urine is currently bloody

## 2019-04-21 ENCOUNTER — Telehealth: Payer: Self-pay

## 2019-04-21 LAB — SURGICAL PATHOLOGY

## 2019-04-21 NOTE — Telephone Encounter (Signed)
Received notification from Dr. Reuel Derby, pathology, the more tissue is being submitted to identify the left ovarian tissue. Case will be delayed. Notified Dr. Gilman Schmidt via secure chat.

## 2019-04-22 ENCOUNTER — Encounter: Payer: Self-pay | Admitting: Urology

## 2019-04-22 ENCOUNTER — Ambulatory Visit (INDEPENDENT_AMBULATORY_CARE_PROVIDER_SITE_OTHER): Payer: 59 | Admitting: Urology

## 2019-04-22 ENCOUNTER — Other Ambulatory Visit: Payer: Self-pay

## 2019-04-22 VITALS — BP 177/121 | HR 98 | Ht 63.0 in | Wt 196.0 lb

## 2019-04-22 DIAGNOSIS — Z466 Encounter for fitting and adjustment of urinary device: Secondary | ICD-10-CM | POA: Diagnosis not present

## 2019-04-22 MED ORDER — CIPROFLOXACIN HCL 500 MG PO TABS
500.0000 mg | ORAL_TABLET | Freq: Once | ORAL | Status: AC
  Administered 2019-04-22: 500 mg via ORAL

## 2019-04-22 NOTE — Progress Notes (Signed)
.     04/22/19  CC:  Chief Complaint  Patient presents with  . Cysto Stent Removal    HPI: 49 year old female with a personal history of uterine fibroids who underwent robotic hysterectomy with Dr. Hedwig Morton. Nechama Guard last week.  Prior to the procedure, she had preoperative ureteral catheters placed and at the end of the procedure, the left open-ended ureteral catheter was converted to a double-J as a precaution due to proximity of the dissection to the left ureter.  Notably, there is no ureteral injury appreciated, stent was placed primarily for the purpose of precaution.  She has been tolerating the stent.  She is some pressure in her left kidney/bladder but has been tolerating the stent well.  UA today is unremarkable other than for blood.  Blood pressure (!) 177/121, pulse 98, height 5\' 3"  (1.6 m), weight 196 lb (88.9 kg). NED. A&Ox3.   No respiratory distress   Abd soft, NT, ND Normal external genitalia with patent urethral meatus  Cystoscopy/ Stent removal procedure  Patient identification was confirmed, informed consent was obtained, and patient was prepped using Betadine solution.  Lidocaine jelly was administered per urethral meatus.    Preoperative abx where received prior to procedure.    Procedure: - Flexible cystoscope introduced, without any difficulty.   - Thorough search of the bladder revealed:    normal urethral meatus  Stent seen emanating from left ureteral orifice, grasped with stent graspers, and removed in entirety.    Post-Procedure: - Patient tolerated the procedure well   Assessment/ Plan:  1. Encounter for removal of ureteral stent Stent removed today without difficulty  Post procedural precautions reviewed  She may follow-up as needed.  - Urinalysis, Complete - ciprofloxacin (CIPRO) tablet 500 mg   Hollice Espy, MD

## 2019-04-24 ENCOUNTER — Inpatient Hospital Stay: Payer: 59 | Attending: Oncology

## 2019-04-24 ENCOUNTER — Other Ambulatory Visit: Payer: Self-pay

## 2019-04-24 DIAGNOSIS — D509 Iron deficiency anemia, unspecified: Secondary | ICD-10-CM | POA: Diagnosis not present

## 2019-04-24 LAB — CBC WITH DIFFERENTIAL/PLATELET
Abs Immature Granulocytes: 0.02 10*3/uL (ref 0.00–0.07)
Basophils Absolute: 0 10*3/uL (ref 0.0–0.1)
Basophils Relative: 1 %
Eosinophils Absolute: 0.3 10*3/uL (ref 0.0–0.5)
Eosinophils Relative: 4 %
HCT: 39.3 % (ref 36.0–46.0)
Hemoglobin: 11.4 g/dL — ABNORMAL LOW (ref 12.0–15.0)
Immature Granulocytes: 0 %
Lymphocytes Relative: 21 %
Lymphs Abs: 1.2 10*3/uL (ref 0.7–4.0)
MCH: 24.7 pg — ABNORMAL LOW (ref 26.0–34.0)
MCHC: 29 g/dL — ABNORMAL LOW (ref 30.0–36.0)
MCV: 85.2 fL (ref 80.0–100.0)
Monocytes Absolute: 0.4 10*3/uL (ref 0.1–1.0)
Monocytes Relative: 6 %
Neutro Abs: 3.8 10*3/uL (ref 1.7–7.7)
Neutrophils Relative %: 68 %
Platelets: 309 10*3/uL (ref 150–400)
RBC: 4.61 MIL/uL (ref 3.87–5.11)
RDW: 19.3 % — ABNORMAL HIGH (ref 11.5–15.5)
WBC: 5.7 10*3/uL (ref 4.0–10.5)
nRBC: 0 % (ref 0.0–0.2)

## 2019-04-24 LAB — MICROSCOPIC EXAMINATION
Bacteria, UA: NONE SEEN
RBC, Urine: >30 /hpf — AB (ref 0–2)

## 2019-04-24 LAB — URINALYSIS, COMPLETE
Bilirubin, UA: NEGATIVE
Glucose, UA: NEGATIVE
Ketones, UA: NEGATIVE
Nitrite, UA: NEGATIVE
Specific Gravity, UA: >1.03 — ABNORMAL HIGH (ref 1.005–1.030)
Urobilinogen, Ur: 0.2 mg/dL (ref 0.2–1.0)
pH, UA: 5.5 (ref 5.0–7.5)

## 2019-04-24 LAB — FERRITIN: Ferritin: 11 ng/mL (ref 11–307)

## 2019-04-24 LAB — IRON AND TIBC
Iron: 26 ug/dL — ABNORMAL LOW (ref 28–170)
Saturation Ratios: 6 % — ABNORMAL LOW (ref 10.4–31.8)
TIBC: 421 ug/dL (ref 250–450)
UIBC: 395 ug/dL

## 2019-04-27 ENCOUNTER — Inpatient Hospital Stay: Payer: 59 | Attending: Oncology | Admitting: Oncology

## 2019-04-27 ENCOUNTER — Other Ambulatory Visit: Payer: Self-pay

## 2019-04-27 ENCOUNTER — Encounter: Payer: Self-pay | Admitting: Oncology

## 2019-04-27 DIAGNOSIS — D509 Iron deficiency anemia, unspecified: Secondary | ICD-10-CM

## 2019-04-27 DIAGNOSIS — I10 Essential (primary) hypertension: Secondary | ICD-10-CM

## 2019-04-27 DIAGNOSIS — Z87891 Personal history of nicotine dependence: Secondary | ICD-10-CM

## 2019-04-27 DIAGNOSIS — Z79899 Other long term (current) drug therapy: Secondary | ICD-10-CM

## 2019-05-08 ENCOUNTER — Ambulatory Visit (INDEPENDENT_AMBULATORY_CARE_PROVIDER_SITE_OTHER): Payer: 59 | Admitting: Obstetrics and Gynecology

## 2019-05-08 ENCOUNTER — Other Ambulatory Visit: Payer: Self-pay

## 2019-05-08 ENCOUNTER — Encounter: Payer: Self-pay | Admitting: Obstetrics and Gynecology

## 2019-05-08 VITALS — BP 170/100 | HR 74 | Ht 63.0 in | Wt 194.0 lb

## 2019-05-08 DIAGNOSIS — Z48816 Encounter for surgical aftercare following surgery on the genitourinary system: Secondary | ICD-10-CM

## 2019-05-08 DIAGNOSIS — Z90710 Acquired absence of both cervix and uterus: Secondary | ICD-10-CM

## 2019-05-08 DIAGNOSIS — Z1211 Encounter for screening for malignant neoplasm of colon: Secondary | ICD-10-CM

## 2019-05-08 DIAGNOSIS — I10 Essential (primary) hypertension: Secondary | ICD-10-CM

## 2019-05-08 MED ORDER — AMLODIPINE BESYLATE 5 MG PO TABS: 5.0000 mg | ORAL_TABLET | Freq: Every day | ORAL | 11 refills | Status: DC

## 2019-05-08 NOTE — Patient Instructions (Signed)
Exercising to Stay Healthy To become healthy and stay healthy, it is recommended that you do moderate-intensity and vigorous-intensity exercise. You can tell that you are exercising at a moderate intensity if your heart starts beating faster and you start breathing faster but can still hold a conversation. You can tell that you are exercising at a vigorous intensity if you are breathing much harder and faster and cannot hold a conversation while exercising. Exercising regularly is important. It has many health benefits, such as:  Improving overall fitness, flexibility, and endurance.  Increasing bone density.  Helping with weight control.  Decreasing body fat.  Increasing muscle strength.  Reducing stress and tension.  Improving overall health. How often should I exercise? Choose an activity that you enjoy, and set realistic goals. Your health care provider can help you make an activity plan that works for you. Exercise regularly as told by your health care provider. This may include:  Doing strength training two times a week, such as: ? Lifting weights. ? Using resistance bands. ? Push-ups. ? Sit-ups. ? Yoga.  Doing a certain intensity of exercise for a given amount of time. Choose from these options: ? A total of 150 minutes of moderate-intensity exercise every week. ? A total of 75 minutes of vigorous-intensity exercise every week. ? A mix of moderate-intensity and vigorous-intensity exercise every week. Children, pregnant women, people who have not exercised regularly, people who are overweight, and older adults may need to talk with a health care provider about what activities are safe to do. If you have a medical condition, be sure to talk with your health care provider before you start a new exercise program. What are some exercise ideas? Moderate-intensity exercise ideas include:  Walking 1 mile (1.6 km) in about 15  minutes.  Biking.  Hiking.  Golfing.  Dancing.  Water aerobics. Vigorous-intensity exercise ideas include:  Walking 4.5 miles (7.2 km) or more in about 1 hour.  Jogging or running 5 miles (8 km) in about 1 hour.  Biking 10 miles (16.1 km) or more in about 1 hour.  Lap swimming.  Roller-skating or in-line skating.  Cross-country skiing.  Vigorous competitive sports, such as football, basketball, and soccer.  Jumping rope.  Aerobic dancing. What are some everyday activities that can help me to get exercise?  Yard work, such as: ? Pushing a lawn mower. ? Raking and bagging leaves.  Washing your car.  Pushing a stroller.  Shoveling snow.  Gardening.  Washing windows or floors. How can I be more active in my day-to-day activities?  Use stairs instead of an elevator.  Take a walk during your lunch break.  If you drive, park your car farther away from your work or school.  If you take public transportation, get off one stop early and walk the rest of the way.  Stand up or walk around during all of your indoor phone calls.  Get up, stretch, and walk around every 30 minutes throughout the day.  Enjoy exercise with a friend. Support to continue exercising will help you keep a regular routine of activity. What guidelines can I follow while exercising?  Before you start a new exercise program, talk with your health care provider.  Do not exercise so much that you hurt yourself, feel dizzy, or get very short of breath.  Wear comfortable clothes and wear shoes with good support.  Drink plenty of water while you exercise to prevent dehydration or heat stroke.  Work out until your breathing   and your heartbeat get faster. Where to find more information  [place] Department of Health and Human Services: www.hhs.gov  Centers for Disease Control and Prevention (CDC): www.cdc.gov Summary  Exercising regularly is important. It will improve your overall fitness,  flexibility, and endurance.  Regular exercise also will improve your overall health. It can help you control your weight, reduce stress, and improve your bone density.  Do not exercise so much that you hurt yourself, feel dizzy, or get very short of breath.  Before you start a new exercise program, talk with your health care provider. This information is not intended to replace advice given to you by your health care provider. Make sure you discuss any questions you have with your health care provider. Document Revised: 02/22/2017 Document Reviewed: 01/31/2017 Elsevier Patient Education  2020 Else[employer]   Budget-Friendly Healthy Eating There are many ways to save money at the grocery store and continue to eat healthy. You can be successful if you:  Plan meals according to your budget.  Make a grocery list and only purchase food according to your grocery list.  Prepare food yourself. What are tips for following this plan?  Reading food labels  Compare food labels between brand name foods and the store brand. Often the nutritional value is the same, but the store brand is lower cost.  Look for products that do not have added sugar, fat, or salt (sodium). These often cost the same but are healthier for you. Products may be labeled as: ? Sugar-free. ? Nonfat. ? Low-fat. ? Sodium-free. ? Low-sodium.  Look for lean ground beef labeled as at least 92% lean and 8% fat. Shopping  Buy only the items on your grocery list and go only to the areas of the store that have the items on your list.  Use coupons only for foods and brands you normally buy. Avoid buying items you wouldn't normally buy simply because they are on sale.  Check online and in newspapers for weekly deals.  Buy healthy items from the bulk bins when available, such as herbs, spices, flour, pasta, nuts, and dried fruit.  Buy fruits and vegetables that are in season. Prices are usually lower on in-season  produce.  Look at the unit price on the price tag. Use it to compare different brands and sizes to find out which item is the best deal.  Choose healthy items that are often low-cost, such as carrots, potatoes, apples, bananas, and oranges. Dried or canned beans are a low-cost protein source.  Buy in bulk and freeze extra food. Items you can buy in bulk include meats, fish, poultry, frozen fruits, and frozen vegetables.  Avoid buying "ready-to-eat" foods, such as pre-cut fruits and vegetables and pre-made salads.  If possible, shop around to discover where you can find the best prices. Consider other retailers such as dollar stores, larger wholesale stores, local fruit and vegetable stands, and farmers markets.  Do not shop when you are hungry. If you shop while hungry, it may be hard to stick to your list and budget.  Resist impulse buying. Use your grocery list as your official plan for the week.  Buy a variety of vegetables and fruits by purchasing fresh, frozen, and canned items.  Look at the top and bottom shelves for deals. Foods at eye level (eye level of an adult or child) are usually more expensive.  Be efficient with your time when shopping. The more time you spend at the store, the more money you   are likely to spend.  To save money when choosing more expensive foods like meats and dairy: ? Choose cheaper cuts of meat, such as bone-in chicken thighs and drumsticks instead of skinless and boneless chicken. When you are ready to prepare the chicken, you can remove the skin yourself to make it healthier. ? Choose lean meats like chicken or turkey instead of beef. ? Choose canned seafood, such as tuna, salmon, or sardines. ? Buy eggs as a low-cost source of protein. ? Buy dried beans and peas, such as lentils, split peas, or kidney beans instead of meats. Dried beans and peas are a good alternative source of protein. ? Buy the larger tubs of yogurt instead of individual-sized  containers.  Choose water instead of sodas and other sweetened beverages.  Avoid buying chips, cookies, and other "junk food." These items are usually expensive and not healthy. Cooking  Make extra food and freeze the extras in meal-sized containers or in individual portions for fast meals and snacks.  Pre-cook on days when you have extra time to prepare meals in advance. You can keep these meals in the fridge or freezer and reheat for a quick meal.  When you come home from the grocery store, wash, peel, and cut fruits and vegetables so they are ready to use and eat. This will help reduce food waste. Meal planning  Do not eat out or get fast food. Prepare food at home.  Make a grocery list and make sure to bring it with you to the store. If you have a smart phone, you could use your phone to create your shopping list.  Plan meals and snacks according to a grocery list and budget you create.  Use leftovers in your meal plan for the week.  Look for recipes where you can cook once and make enough food for two meals.  Include budget-friendly meals like stews, casseroles, and stir-fry dishes.  Try some meatless meals or try "no cook" meals like salads.  Make sure that half your plate is filled with fruits or vegetables. Choose from fresh, frozen, or canned fruits and vegetables. If eating canned, remember to rinse them before eating. This will remove any excess salt added for packaging. Summary  Eating healthy on a budget is possible if you plan your meals according to your budget, purchase according to your budget and grocery list, and prepare food yourself.  Tips for buying more food on a limited budget include buying generic brands, using coupons only for foods you normally buy, and buying healthy items from the bulk bins when available.  Tips for buying cheaper food to replace expensive food include choosing cheaper, lean cuts of meat, and buying dried beans and peas. This  information is not intended to replace advice given to you by your health care provider. Make sure you discuss any questions you have with your health care provider. Document Revised: 03/13/2017 Document Reviewed: 03/13/2017 Elsevier Patient Education  2020 Elsevier Inc.   

## 2019-05-08 NOTE — Progress Notes (Signed)
  Postoperative Follow-up Patient presents post op from laparoscopic assisted vaginal hysterectomy for abnormal uterine bleeding and fibroids, 2 weeks ago.  Subjective: Patient reports marked improvement in her preop symptoms. Eating a regular diet without difficulty. Pain is controlled without any medications.  Activity: normal activities of daily living. Patient reports additional symptom's since surgery of None.  Objective: BP (!) 170/100   Pulse 74   Ht 5\' 3"  (1.6 m)   Wt 194 lb (88 kg)   BMI 34.37 kg/m  Physical Exam Constitutional:      Appearance: She is well-developed.  Genitourinary:     Vagina and uterus normal.     No lesions in the vagina.     No cervical motion tenderness.     No right or left adnexal mass present.  HENT:     Head: Normocephalic and atraumatic.  Neck:     Thyroid: No thyromegaly.  Cardiovascular:     Rate and Rhythm: Normal rate and regular rhythm.     Heart sounds: Normal heart sounds.  Pulmonary:     Effort: Pulmonary effort is normal.     Breath sounds: Normal breath sounds.  Chest:     Breasts:        Right: No inverted nipple, mass, nipple discharge or skin change.        Left: No inverted nipple, mass, nipple discharge or skin change.  Abdominal:     General: Bowel sounds are normal. There is no distension.     Palpations: Abdomen is soft. There is no mass.  Musculoskeletal:     Cervical back: Neck supple.  Neurological:     Mental Status: She is alert and oriented to person, place, and time.  Skin:    General: Skin is warm and dry.  Psychiatric:        Behavior: Behavior normal.        Thought Content: Thought content normal.        Judgment: Judgment normal.  Vitals reviewed.     Recheck 146/110  Assessment: s/p :  total laparoscopic hysterectomy with bilateral salpingectomy unilateral oophorectomy- stable  Plan: Patient has done well after surgery with no apparent complications.  I have discussed the post-operative  course to date, and the expected progress moving forward.  The patient understands what complications to be concerned about.  I will see the patient in routine follow up, or sooner if needed.    Will start amlodipine for elevated BP Discussed healthy lifestyle modifications Trimmed suture at incision site- healing well Congratulated on stopping smoking  Activity plan: No heavy lifting. Pelvic rest.  Shloime Keilman R Ji Feldner 05/08/2019, 12:16 PM

## 2019-05-11 ENCOUNTER — Telehealth: Payer: Self-pay

## 2019-05-11 ENCOUNTER — Other Ambulatory Visit: Payer: Self-pay

## 2019-05-11 DIAGNOSIS — Z1211 Encounter for screening for malignant neoplasm of colon: Secondary | ICD-10-CM

## 2019-05-11 MED ORDER — NA SULFATE-K SULFATE-MG SULF 17.5-3.13-1.6 GM/177ML PO SOLN: 354.0000 mL | Freq: Once | ORAL | 0 refills | Status: AC

## 2019-05-11 NOTE — Telephone Encounter (Signed)
Gastroenterology Pre-Procedure Review    PATIENT REVIEW QUESTIONS: The patient responded to the following health history questions as indicated:    1. Are you having any GI issues? no 2. Do you have a personal history of Polyps? no 3. Do you have a family history of Colon Cancer or Polyps? Maternal Aunt Colon polyps  4. Diabetes Mellitus? no 5. Joint replacements in the past 12 months?no 6. Major health problems in the past 3 months?no 7. Any artificial heart valves, MVP, or defibrillator?no    MEDICATIONS & ALLERGIES:    Patient reports the following regarding taking any anticoagulation/antiplatelet therapy:   Plavix, Coumadin, Eliquis, Xarelto, Lovenox, Pradaxa, Brilinta, or Effient? no Aspirin? no  Patient confirms/reports the following medications:  Current Outpatient Medications  Medication Sig Dispense Refill  . amLODipine (NORVASC) 5 MG tablet Take 1 tablet (5 mg total) by mouth daily. 30 tablet 11  . ibuprofen (ADVIL) 800 MG tablet Take 1 tablet (800 mg total) by mouth every 8 (eight) hours as needed for cramping. 30 tablet 6  . metoCLOPramide (REGLAN) 10 MG tablet Take 1 tablet (10 mg total) by mouth 3 (three) times daily with meals. (Patient not taking: Reported on 05/08/2019) 90 tablet 1  . olopatadine (PATADAY) 0.1 % ophthalmic solution Place 1 drop into both eyes daily as needed for allergies.    Marland Kitchen ondansetron (ZOFRAN) 4 MG tablet Take 1 tablet (4 mg total) by mouth every 6 (six) hours as needed for nausea. (Patient not taking: Reported on 04/27/2019) 20 tablet 0  . oxyCODONE (OXY IR/ROXICODONE) 5 MG immediate release tablet Take 1-2 tablets (5-10 mg total) by mouth every 4 (four) hours as needed for moderate pain. (Patient not taking: Reported on 04/27/2019) 30 tablet 0   No current facility-administered medications for this visit.    Patient confirms/reports the following allergies:  No Known Allergies  No orders of the defined types were placed in this  encounter.   AUTHORIZATION INFORMATION Primary Insurance: 1D#: Group #:  Secondary Insurance: 1D#: Group #:  SCHEDULE INFORMATION: Date: 06/10/2019 Time: Location: [place]

## 2019-05-25 NOTE — Progress Notes (Signed)
I connected with Tiffany Rodriguez on 05/25/19 at  2:45 PM EST by video enabled telemedicine visit and verified that I am speaking with the correct person using two identifiers.   I discussed the limitations, risks, security and privacy concerns of performing an evaluation and management service by telemedicine and the availability of in-person appointments. I also discussed with the patient that there may be a patient responsible charge related to this service. The patient expressed understanding and agreed to proceed.  Other persons participating in the visit and their role in the encounter:  none  Patient's location:  home Provider's location:  work  Risk analyst Complaint: Routine follow-up of iron deficiency anemia  Diagnosis: Iron deficiency anemia  History of present illness: patient is a 49 year old African-American female referred to [place] for anemia. She has been following up with [facility] Dr. Gilman Schmidt for abnormal uterine bleeding.Most recent hemoglobin from 12/18/2018 showed white count of 6.4, H&H of 7.1/23.4 with an MCV of 71 and a platelet count of 177. She is also received blood transfusion recently.Endometrial biopsy showed no evidence of hyperplasia or malignancy.  Patient states she has had Irregular and heavy menstrual bleeding that has been on and off for many years but particularly worse since 1 year.  Patient underwent vaginal hysterectomy in January 2021.   Interval history currently patient reports doing well denies any complaints at this time   Review of Systems  Constitutional: Negative for chills, fever, malaise/fatigue and weight loss.  HENT: Negative for congestion, ear discharge and nosebleeds.   Eyes: Negative for blurred vision.  Respiratory: Negative for cough, hemoptysis, sputum production, shortness of breath and wheezing.   Cardiovascular: Negative for chest pain, palpitations, orthopnea and claudication.  Gastrointestinal: Negative for abdominal  pain, blood in stool, constipation, diarrhea, heartburn, melena, nausea and vomiting.  Genitourinary: Negative for dysuria, flank pain, frequency, hematuria and urgency.  Musculoskeletal: Negative for back pain, joint pain and myalgias.  Skin: Negative for rash.  Neurological: Negative for dizziness, tingling, focal weakness, seizures, weakness and headaches.  Endo/Heme/Allergies: Does not bruise/bleed easily.  Psychiatric/Behavioral: Negative for depression and suicidal ideas. The patient does not have insomnia.     No Known Allergies  Past Medical History:  Diagnosis Date  . Anemia   . Arthritis    right knee  . Asthma     Past Surgical History:  Procedure Laterality Date  . CYSTOSCOPY W/ URETERAL STENT PLACEMENT Left 04/15/2019   Procedure: CYSTOSCOPY WITH RETROGRADE PYELOGRAM/URETERAL STENT PLACEMENT;  Surgeon: Gillis Ends, MD;  Location: [facility] ORS;  Service: Gynecology;  Laterality: Left;  . DILATION AND CURETTAGE OF UTERUS  2006  . KNEE ARTHROSCOPY Right   . ROBOTIC ASSISTED TOTAL HYSTERECTOMY WITH BILATERAL SALPINGO OOPHERECTOMY N/A 04/15/2019   Procedure: XI ROBOTIC ASSISTED LAPAROSCOPIC TOTAL HYSTERECTOMY WITH BILATERAL SALPINGECTOMY ,LEFT  OOPHORECTOMY;  Surgeon: Gillis Ends, MD;  Location: [facility] ORS;  Service: Gynecology;  Laterality: N/A;    Social History   Socioeconomic History  . Marital status: Single    Spouse name: Not on file  . Number of children: Not on file  . Years of education: Not on file  . Highest education level: Not on file  Occupational History  . Not on file  Tobacco Use  . Smoking status: Former Smoker    Packs/day: 1.00    Types: Cigarettes    Quit date: 04/13/2019    Years since quitting: 0.1  . Smokeless tobacco: Never Used  Substance and Sexual Activity  . Alcohol use:  Yes    Comment: occassional  . Drug use: Never  . Sexual activity: Not Currently    Birth control/protection: None  Other Topics Concern  . Not  on file  Social History Narrative  . Not on file   Social Determinants of Health   Financial Resource Strain:   . Difficulty of Paying Living Expenses: Not on file  Food Insecurity:   . Worried About Charity fundraiser in the Last Year: Not on file  . Ran Out of Food in the Last Year: Not on file  Transportation Needs:   . Lack of Transportation (Medical): Not on file  . Lack of Transportation (Non-Medical): Not on file  Physical Activity:   . Days of Exercise per Week: Not on file  . Minutes of Exercise per Session: Not on file  Stress:   . Feeling of Stress : Not on file  Social Connections:   . Frequency of Communication with Friends and Family: Not on file  . Frequency of Social Gatherings with Friends and Family: Not on file  . Attends Religious Services: Not on file  . Active Member of Clubs or Organizations: Not on file  . Attends Archivist Meetings: Not on file  . Marital Status: Not on file  Intimate Partner Violence:   . Fear of Current or Ex-Partner: Not on file  . Emotionally Abused: Not on file  . Physically Abused: Not on file  . Sexually Abused: Not on file    Family History  Problem Relation Age of Onset  . Diabetes Paternal Grandmother   . Leukemia Paternal Uncle   . Leukemia Paternal Grandfather      Current Outpatient Medications:  .  ibuprofen (ADVIL) 800 MG tablet, Take 1 tablet (800 mg total) by mouth every 8 (eight) hours as needed for cramping., Disp: 30 tablet, Rfl: 6 .  metoCLOPramide (REGLAN) 10 MG tablet, Take 1 tablet (10 mg total) by mouth 3 (three) times daily with meals. (Patient not taking: Reported on 05/08/2019), Disp: 90 tablet, Rfl: 1 .  olopatadine (PATADAY) 0.1 % ophthalmic solution, Place 1 drop into both eyes daily as needed for allergies., Disp: , Rfl:  .  amLODipine (NORVASC) 5 MG tablet, Take 1 tablet (5 mg total) by mouth daily., Disp: 30 tablet, Rfl: 11 .  ondansetron (ZOFRAN) 4 MG tablet, Take 1 tablet (4 mg  total) by mouth every 6 (six) hours as needed for nausea. (Patient not taking: Reported on 04/27/2019), Disp: 20 tablet, Rfl: 0 .  oxyCODONE (OXY IR/ROXICODONE) 5 MG immediate release tablet, Take 1-2 tablets (5-10 mg total) by mouth every 4 (four) hours as needed for moderate pain. (Patient not taking: Reported on 04/27/2019), Disp: 30 tablet, Rfl: 0  No results found.  No images are attached to the encounter.   CMP Latest Ref Rng & Units 12/30/2018  Glucose 70 - 99 mg/dL 88  BUN 6 - 20 mg/dL 12  Creatinine 0.44 - 1.00 mg/dL 0.70  Sodium 135 - 145 mmol/L 138  Potassium 3.5 - 5.1 mmol/L 3.6  Chloride 98 - 111 mmol/L 108  CO2 22 - 32 mmol/L 23  Calcium 8.9 - 10.3 mg/dL 9.1  Total Protein 6.5 - 8.1 g/dL 6.4(L)  Total Bilirubin 0.3 - 1.2 mg/dL 0.2(L)  Alkaline Phos 38 - 126 U/L 43  AST 15 - 41 U/L 16  ALT 0 - 44 U/L 13   CBC Latest Ref Rng & Units 04/24/2019  WBC 4.0 - 10.5 K/uL 5.7  Hemoglobin 12.0 - 15.0 g/dL 11.4(L)  Hematocrit 36.0 - 46.0 % 39.3  Platelets 150 - 400 K/uL 309     Observation/objective: Appears in no acute distress over video visit today.  Breathing is nonlabored  Assessment and plan: Patient is a 49 year old female with history of iron deficiency anemia secondary to menorrhagia s/p vaginal hysterectomy.  This is a routine follow-up visit for iron deficiency anemia  Patient is currently mildly anemic and she just finished her Feraheme in December 2020.  Her iron studies do show Continued iron deficiency with a ferritin value of 11 and a low iron saturation of 6%.  Since patient just underwent a hysterectomy as well I would like to hold off on IV iron at this time and repeat her CBC ferritin and iron studies in 3 in 6 months we will see her back in 6 months.  If she continues to be anemic at 3 months I will give her more IV iron.  A  Follow-up instructions: as above  I discussed the assessment and treatment plan with the patient. The patient was provided an opportunity  to ask questions and all were answered. The patient agreed with the plan and demonstrated an understanding of the instructions.   The patient was advised to call back or seek an in-person evaluation if the symptoms worsen or if the condition fails to improve as anticipated.    Visit Diagnosis: 1. Iron deficiency anemia, unspecified iron deficiency anemia type     Dr. Randa Evens, MD, MPH [facility] at [facility] Tel- [phone] 05/25/2019 2:46 PM

## 2019-06-05 ENCOUNTER — Other Ambulatory Visit: Payer: Self-pay

## 2019-06-05 ENCOUNTER — Ambulatory Visit (INDEPENDENT_AMBULATORY_CARE_PROVIDER_SITE_OTHER): Payer: 59 | Admitting: Obstetrics and Gynecology

## 2019-06-05 ENCOUNTER — Encounter: Payer: Self-pay | Admitting: Obstetrics and Gynecology

## 2019-06-05 VITALS — BP 142/90 | Ht 63.0 in | Wt 189.0 lb

## 2019-06-05 DIAGNOSIS — Z90710 Acquired absence of both cervix and uterus: Secondary | ICD-10-CM

## 2019-06-05 NOTE — Progress Notes (Signed)
  Postoperative Follow-up Patient presents post op from Robotic assisted total hysterectomy, left oophorectomy, and bilateral salpingectomy with contained manual morcellation within a laparoscopic bag for abnormal uterine bleeding, uterine fibroids and adenomyosis,  2 months ago.  Subjective: Patient reports marked improvement in her preop symptoms. Eating a regular diet without difficulty. The patient is not having any pain.  Activity: normal activities of daily living. Patient reports additional symptom's since surgery of None.  Objective: BP (!) 142/90   Ht 5\' 3"  (1.6 m)   Wt 189 lb (85.7 kg)   BMI 33.48 kg/m  Physical Exam Constitutional:      Appearance: She is well-developed.  Genitourinary:     Vagina and uterus normal.     No lesions in the vagina.     No cervical motion tenderness.     No right or left adnexal mass present.  HENT:     Head: Normocephalic and atraumatic.  Neck:     Thyroid: No thyromegaly.  Cardiovascular:     Rate and Rhythm: Normal rate and regular rhythm.     Heart sounds: Normal heart sounds.  Pulmonary:     Effort: Pulmonary effort is normal.     Breath sounds: Normal breath sounds.  Chest:     Breasts:        Right: No inverted nipple, mass, nipple discharge or skin change.        Left: No inverted nipple, mass, nipple discharge or skin change.  Abdominal:     General: Bowel sounds are normal. There is no distension.     Palpations: Abdomen is soft. There is no mass.     Comments: Incisions healing well  Musculoskeletal:     Cervical back: Neck supple.  Neurological:     Mental Status: She is alert and oriented to person, place, and time.  Skin:    General: Skin is warm and dry.  Psychiatric:        Behavior: Behavior normal.        Thought Content: Thought content normal.        Judgment: Judgment normal.  Vitals reviewed.    Assessment: s/p :    stable  Plan: Patient has done well after surgery with no apparent complications.  I  have discussed the post-operative course to date, and the expected progress moving forward.  The patient understands what complications to be concerned about.  I will see the patient in routine follow up, or sooner if needed.    Activity plan: No restriction.  Pelvic rest.  Congratulated on weight loss and continued success with smoking cessation.  She did not take BP medication this AM, but will when she returns home. She is motivated to eventually stop the medication through healthy lifestyle measures.  Incision healing well.  Encouraged her to have her mammogram. She is scheduled for a colonoscopy soon.   Andersson Larrabee R Makenzy Krist 06/05/2019, 12:07 PM

## 2019-06-18 ENCOUNTER — Other Ambulatory Visit: Admission: RE | Admit: 2019-06-18 | Payer: 59 | Source: Ambulatory Visit

## 2019-06-19 ENCOUNTER — Telehealth: Payer: Self-pay

## 2019-06-19 NOTE — Telephone Encounter (Signed)
Returned pt's call to reschedule colonoscopy currently scheduled for 06/22/19

## 2019-06-22 ENCOUNTER — Ambulatory Visit: Admission: RE | Admit: 2019-06-22 | Payer: 59 | Source: Ambulatory Visit | Admitting: Gastroenterology

## 2019-06-22 ENCOUNTER — Encounter: Admission: RE | Payer: Self-pay | Source: Ambulatory Visit

## 2019-06-22 SURGERY — COLONOSCOPY WITH PROPOFOL
Anesthesia: General

## 2019-06-22 NOTE — Telephone Encounter (Signed)
Patient left voicemail requesting to have her coloscopy moved to April 19th. Please call patient

## 2019-07-10 ENCOUNTER — Ambulatory Visit
Admission: RE | Admit: 2019-07-10 | Discharge: 2019-07-10 | Disposition: A | Discharge status: Home / Self Care | Payer: 59 | Source: Ambulatory Visit | Attending: Obstetrics and Gynecology | Admitting: Obstetrics and Gynecology

## 2019-07-10 ENCOUNTER — Ambulatory Visit: Payer: Self-pay

## 2019-07-10 ENCOUNTER — Ambulatory Visit (INDEPENDENT_AMBULATORY_CARE_PROVIDER_SITE_OTHER): Payer: 59 | Admitting: Obstetrics and Gynecology

## 2019-07-10 ENCOUNTER — Other Ambulatory Visit: Payer: Self-pay

## 2019-07-10 ENCOUNTER — Ambulatory Visit: Payer: Self-pay | Admitting: Oncology

## 2019-07-10 ENCOUNTER — Encounter: Payer: Self-pay | Admitting: Obstetrics and Gynecology

## 2019-07-10 VITALS — BP 152/98 | Ht 63.0 in | Wt 186.0 lb

## 2019-07-10 DIAGNOSIS — I10 Essential (primary) hypertension: Secondary | ICD-10-CM

## 2019-07-10 DIAGNOSIS — Z1231 Encounter for screening mammogram for malignant neoplasm of breast: Secondary | ICD-10-CM | POA: Diagnosis not present

## 2019-07-10 MED ORDER — AMLODIPINE BESYLATE 10 MG PO TABS: 10.0000 mg | ORAL_TABLET | Freq: Every day | ORAL | 11 refills | Status: DC

## 2019-07-16 ENCOUNTER — Telehealth: Payer: Self-pay | Admitting: Oncology

## 2019-07-16 NOTE — Telephone Encounter (Signed)
MD will not be in the office on 10-27-19. Writer spoke with patient on this date and rescheduled appts.

## 2019-07-23 ENCOUNTER — Telehealth: Payer: Self-pay | Admitting: *Deleted

## 2019-07-23 NOTE — Telephone Encounter (Signed)
pt called to cx her 07/24/19 lab appt. per pt request appt was cx. Pt didn't request to r/s appt at this time.Tiffany Rodriguez

## 2019-07-24 ENCOUNTER — Inpatient Hospital Stay: Payer: 59

## 2019-10-26 ENCOUNTER — Other Ambulatory Visit: Payer: 59

## 2019-10-27 ENCOUNTER — Telehealth: Payer: 59 | Admitting: Oncology

## 2019-11-19 ENCOUNTER — Inpatient Hospital Stay: Payer: 59 | Attending: Oncology

## 2019-11-23 ENCOUNTER — Other Ambulatory Visit: Payer: Self-pay

## 2019-11-23 ENCOUNTER — Inpatient Hospital Stay: Payer: 59 | Admitting: Oncology

## 2019-11-27 ENCOUNTER — Other Ambulatory Visit: Payer: Self-pay

## 2019-11-27 ENCOUNTER — Inpatient Hospital Stay: Payer: 59 | Attending: Oncology

## 2019-11-27 DIAGNOSIS — D509 Iron deficiency anemia, unspecified: Secondary | ICD-10-CM | POA: Diagnosis present

## 2019-11-27 LAB — CBC WITH DIFFERENTIAL/PLATELET
Abs Immature Granulocytes: 0 10*3/uL (ref 0.00–0.07)
Basophils Absolute: 0 10*3/uL (ref 0.0–0.1)
Basophils Relative: 1 %
Eosinophils Absolute: 0.1 10*3/uL (ref 0.0–0.5)
Eosinophils Relative: 3 %
HCT: 40.6 % (ref 36.0–46.0)
Hemoglobin: 13.8 g/dL (ref 12.0–15.0)
Immature Granulocytes: 0 %
Lymphocytes Relative: 41 %
Lymphs Abs: 1.2 10*3/uL (ref 0.7–4.0)
MCH: 29.7 pg (ref 26.0–34.0)
MCHC: 34 g/dL (ref 30.0–36.0)
MCV: 87.5 fL (ref 80.0–100.0)
Monocytes Absolute: 0.3 10*3/uL (ref 0.1–1.0)
Monocytes Relative: 9 %
Neutro Abs: 1.3 10*3/uL — ABNORMAL LOW (ref 1.7–7.7)
Neutrophils Relative %: 46 %
Platelets: 215 10*3/uL (ref 150–400)
RBC: 4.64 MIL/uL (ref 3.87–5.11)
RDW: 13.7 % (ref 11.5–15.5)
WBC: 2.9 10*3/uL — ABNORMAL LOW (ref 4.0–10.5)
nRBC: 0 % (ref 0.0–0.2)

## 2019-11-27 LAB — IRON AND TIBC
Iron: 74 ug/dL (ref 28–170)
Saturation Ratios: 19 % (ref 10.4–31.8)
TIBC: 398 ug/dL (ref 250–450)
UIBC: 324 ug/dL

## 2019-11-27 LAB — FERRITIN: Ferritin: 12 ng/mL (ref 11–307)

## 2019-12-01 ENCOUNTER — Telehealth: Payer: Self-pay | Admitting: Oncology

## 2019-12-01 ENCOUNTER — Inpatient Hospital Stay: Payer: 59 | Admitting: Oncology

## 2019-12-01 NOTE — Telephone Encounter (Signed)
error 

## 2019-12-03 ENCOUNTER — Encounter: Payer: Self-pay | Admitting: Oncology

## 2019-12-03 ENCOUNTER — Telehealth: Payer: Self-pay | Admitting: Oncology

## 2019-12-03 NOTE — Telephone Encounter (Signed)
Patient completed labs, but did not complete video visit with MD on 12-01-19. Writer phoned patient on this date and left voicemail requesting a phone call to reschedule. Writer also mailed patient a letter on this date requesting return call to reschedule.

## 2020-04-05 ENCOUNTER — Ambulatory Visit (INDEPENDENT_AMBULATORY_CARE_PROVIDER_SITE_OTHER): Payer: 59 | Admitting: Obstetrics and Gynecology

## 2020-04-05 ENCOUNTER — Other Ambulatory Visit: Payer: Self-pay

## 2020-04-05 ENCOUNTER — Encounter: Payer: Self-pay | Admitting: Obstetrics and Gynecology

## 2020-04-05 VITALS — BP 120/70 | Ht 63.0 in | Wt 171.2 lb

## 2020-04-05 DIAGNOSIS — Z01419 Encounter for gynecological examination (general) (routine) without abnormal findings: Secondary | ICD-10-CM

## 2020-04-05 DIAGNOSIS — Z01411 Encounter for gynecological examination (general) (routine) with abnormal findings: Secondary | ICD-10-CM

## 2020-04-05 DIAGNOSIS — Z0000 Encounter for general adult medical examination without abnormal findings: Secondary | ICD-10-CM

## 2020-04-05 DIAGNOSIS — Z1211 Encounter for screening for malignant neoplasm of colon: Secondary | ICD-10-CM

## 2020-04-05 DIAGNOSIS — R875 Abnormal microbiological findings in specimens from female genital organs: Secondary | ICD-10-CM

## 2020-04-05 DIAGNOSIS — Z1231 Encounter for screening mammogram for malignant neoplasm of breast: Secondary | ICD-10-CM

## 2020-04-05 DIAGNOSIS — N760 Acute vaginitis: Secondary | ICD-10-CM

## 2020-04-05 NOTE — Progress Notes (Signed)
Annual Exam

## 2020-04-05 NOTE — Progress Notes (Signed)
Gynecology Annual Exam  PCP: Patient, No Pcp Per  Chief Complaint:  Chief Complaint  Patient presents with  . Gynecologic Exam    Annual exam     History of Present Illness: Patient is a 50 y.o. [id number] presents for annual exam. The patient has no complaints today.   LMP: Patient's last menstrual period was 03/20/2019 (exact date). She has had a hysterectomy  Reports 3 episodes of pink spoting in the last several months when wiping and clumpy white discharge  The patient is sexually active. She denies dyspareunia.  Postcoital Bleeding: no   The patient does perform self breast exams.  There is notable family history of breast or ovarian cancer in her family.  The patient has regular exercise: no, has changed diet. Lost 30 lbs in last year and stopped smoking  The patient denies current symptoms of depression.   Review of Systems: ROS  Past Medical History:  Past Medical History:  Diagnosis Date  . Anemia   . Arthritis    right knee  . Asthma     Past Surgical History:  Past Surgical History:  Procedure Laterality Date  . CYSTOSCOPY W/ URETERAL STENT PLACEMENT Left 04/15/2019   Procedure: CYSTOSCOPY WITH RETROGRADE PYELOGRAM/URETERAL STENT PLACEMENT;  Surgeon: Artelia Laroche, MD;  Location: [facility] ORS;  Service: Gynecology;  Laterality: Left;  . DILATION AND CURETTAGE OF UTERUS  2006  . KNEE ARTHROSCOPY Right   . ROBOTIC ASSISTED TOTAL HYSTERECTOMY WITH BILATERAL SALPINGO OOPHERECTOMY N/A 04/15/2019   Procedure: XI ROBOTIC ASSISTED LAPAROSCOPIC TOTAL HYSTERECTOMY WITH BILATERAL SALPINGECTOMY ,LEFT  OOPHORECTOMY;  Surgeon: Artelia Laroche, MD;  Location: [facility] ORS;  Service: Gynecology;  Laterality: N/A;    Gynecologic History:  Patient's last menstrual period was 03/20/2019 (exact date). Last Pap: Results were: 2021 NIL  Last mammogram: 2021 Results were: BI-RAD I  Obstetric History: [id number]  Family History:  Family History  Problem Relation  Age of Onset  . Diabetes Paternal Grandmother   . Leukemia Paternal Uncle   . Leukemia Paternal Grandfather     Social History:  Social History   Socioeconomic History  . Marital status: Single    Spouse name: Not on file  . Number of children: Not on file  . Years of education: Not on file  . Highest education level: Not on file  Occupational History  . Not on file  Tobacco Use  . Smoking status: Former Smoker    Packs/day: 1.00    Types: Cigarettes    Quit date: 04/13/2019    Years since quitting: 0.9  . Smokeless tobacco: Never Used  Vaping Use  . Vaping Use: Never used  Substance and Sexual Activity  . Alcohol use: Yes    Comment: occassional  . Drug use: Never  . Sexual activity: Not Currently    Birth control/protection: None  Other Topics Concern  . Not on file  Social History Narrative  . Not on file   Social Determinants of Health   Financial Resource Strain: Not on file  Food Insecurity: Not on file  Transportation Needs: Not on file  Physical Activity: Not on file  Stress: Not on file  Social Connections: Not on file  Intimate Partner Violence: Not on file    Allergies:  No Known Allergies  Medications: Prior to Admission medications   Medication Sig Start Date End Date Taking? Authorizing Provider  amLODipine (NORVASC) 10 MG tablet Take 1 tablet (10 mg total) by mouth daily. 07/10/19  Yes  Homero Fellers, MD  ibuprofen (ADVIL) 800 MG tablet Take 1 tablet (800 mg total) by mouth every 8 (eight) hours as needed for cramping. 04/16/19  Yes Deboraha Goar R, MD  olopatadine (PATANOL) 0.1 % ophthalmic solution Place 1 drop into both eyes daily as needed for allergies.   Yes [provider]  rosuvastatin (CRESTOR) 10 MG tablet Take 10 mg by mouth at bedtime. 03/13/20  Yes [provider]    Physical Exam Vitals: Blood pressure 120/70, height 5\' 3"  (1.6 m), weight 171 lb 3.2 oz (77.7 kg), last menstrual period  03/20/2019.  Physical Exam Constitutional:      Appearance: She is well-developed.  Genitourinary:     Vagina and uterus normal.     There is no lesion on the right labia.     There is no lesion on the left labia.    No lesions in the vagina.     Genitourinary Comments: External: Normal appearing vulva. No lesions noted.  Speculum examination: Normal vaginal cuff, well healed. No blood in the vaginal vault. No discharge.   Bimanual examination: Uterus absent. No adnexal masses. No adnexal tenderness. Pelvis not fixed.       Right Adnexa: no mass present.    Left Adnexa: no mass present.    No cervical motion tenderness.  Breasts:     Right: No inverted nipple, mass, nipple discharge or skin change.     Left: No inverted nipple, mass, nipple discharge or skin change.    HENT:     Head: Normocephalic and atraumatic.  Eyes:     Extraocular Movements: EOM normal.  Neck:     Thyroid: No thyromegaly.  Cardiovascular:     Rate and Rhythm: Normal rate and regular rhythm.     Heart sounds: Normal heart sounds.  Pulmonary:     Effort: Pulmonary effort is normal.     Breath sounds: Normal breath sounds.  Abdominal:     General: Bowel sounds are normal. There is no distension.     Palpations: Abdomen is soft. There is no mass.  Musculoskeletal:     Cervical back: Neck supple.  Neurological:     Mental Status: She is alert and oriented to person, place, and time.  Skin:    General: Skin is warm and dry.  Psychiatric:        Mood and Affect: Mood and affect normal.        Behavior: Behavior normal.        Thought Content: Thought content normal.        Judgment: Judgment normal.  Vitals reviewed. Exam conducted with a chaperone present.    Wet Prep: Clue Cells: Negative Fungal elements: Negative Trichomonas: Negative   Female chaperone present for pelvic and breast  portions of the physical exam  Assessment: 50 y.o. [id number] routine annual exam  Plan: Problem List Items  Addressed This Visit   None   Visit Diagnoses    Encounter for annual routine gynecological examination    -  Primary   Health maintenance examination       Breast cancer screening by mammogram       Relevant Orders   MM 3D SCREEN BREAST BILATERAL   Encounter for gynecological examination with abnormal finding       Acute vaginitis       Relevant Orders   NuSwab BV and Candida, NAA   Abnormal microbiological finding in specimen from female genital organ  Relevant Orders   NuSwab BV and Candida, NAA   Screening for colon cancer       Relevant Orders   Ambulatory referral to Gastroenterology      1) Mammogram - recommend yearly screening mammogram.  Mammogram Was ordered today  2) STI screening was offered and declined  3) ASCCP guidelines and rational discussed.  Patient opts for Discontinue. S/p hysterectomy, no history of CIN in last 20 years  4) Colonoscopy --   Screening recommended starting at age 41 for average risk individuals, age 35 for individuals deemed at increased risk (including African Americans) and recommended to continue until age 31.  For patient age 44-85 individualized approach is recommended.  Gold standard screening is via colonoscopy, Cologuard screening is an acceptable alternative for patient unwilling or unable to undergo colonoscopy.  "Colorectal cancer screening for average?risk adults: 2018 guideline update from the American Cancer Society"[place]: A Cancer Journal for Clinicians: Aug 22, 2016   5) Routine healthcare maintenance including cholesterol, diabetes screening discussed managed by PCP  6) Osteoporosis screening - early screening not indicated at this time, start at 65.   7) Vaginal spotting- will check for infection with nuswab. Discussed hygiene and given list of vaginal lubricants  Adrian Prows MD, [facility] 04/05/2020 4:30 PM

## 2020-04-05 NOTE — Patient Instructions (Addendum)
[facility] [place] for Calcium and Vitamin D  Age (yr) Calcium Recommended Dietary Allowance (mg/day) Vitamin D Recommended Dietary Allowance (international units/day)  9-18 1,300 600  19-50 1,000 600  51-70 1,200 600  71 and older 1,200 800  Data from Institute of Medicine. Dietary reference intakes: calcium, vitamin D. [place], [place]: [employer]; 2011.     General vulvovaginal hygiene measures Keep vulva clean, dry, and well aerated  0 Avoid sleeper pajamas. Nightgowns allow air to circulate.  0 Cotton underpants. Double-rinse underwear after washing to avoid residual irritants. Do not use fabric softeners for underwear and swimsuits.  0 Avoid tights, leotards, and leggings. Skirts and loose-fitting pants allow air to circulate.  0 Avoid sitting in wet swimsuits for long periods of time.  Daily warm bathing   0 Do not use bubble baths or perfumed soaps.  0 soak in clean water (no soap) for 10 to 15 minutes.  0 Use soap to wash regions other than the genital area just before getting out of the tub. Limit use of any soap on genital areas.  0 Rinse the genital area well and gently pat dry.  0 A hair dryer on the cool setting may be helpful to assist with drying the genital region.  0 If the vulvar area is tender or swollen, cool compresses may relieve the discomfort. Emollients may help protect skin.  Review toilet hygiene    1 Sit with knees apart to reduce reflux of urine into the vagina.    0 Emphasize wiping front to back after bowel movements.  0 Wet wipes can be used instead of toilet paper for wiping as long as they don't cause a "stinging" sensation.     Vaginal/ Vulvar Moisturizer Use 3-5 times a week at bedtime  Hyalo Gyn  Revaree  Replens  Isaiah Blakes Key-E suppositories  Vitamin E oil, olive oil, coconut oil   Water-based Lubricants  Astroglide [place] Jelly  Luvena Aquagel  Silicone- based  Lubricants Pjur PINK Astroglide silicone Uberlube    Exercising to Stay Healthy To become healthy and stay healthy, it is recommended that you do moderate-intensity and vigorous-intensity exercise. You can tell that you are exercising at a moderate intensity if your heart starts beating faster and you start breathing faster but can still hold a conversation. You can tell that you are exercising at a vigorous intensity if you are breathing much harder and faster and cannot hold a conversation while exercising. Exercising regularly is important. It has many health benefits, such as:  Improving overall fitness, flexibility, and endurance.  Increasing bone density.  Helping with weight control.  Decreasing body fat.  Increasing muscle strength.  Reducing stress and tension.  Improving overall health. How often should I exercise? Choose an activity that you enjoy, and set realistic goals. Your health care provider can help you make an activity plan that works for you. Exercise regularly as told by your health care provider. This may include:  Doing strength training two times a week, such as: ? Lifting weights. ? Using resistance bands. ? Push-ups. ? Sit-ups. ? Yoga.  Doing a certain intensity of exercise for a given amount of time. Choose from these options: ? A total of 150 minutes of moderate-intensity exercise every week. ? A total of 75 minutes of vigorous-intensity exercise every week. ? A mix of moderate-intensity and vigorous-intensity exercise every week. Children, pregnant women, people who have not exercised regularly, people who are overweight, and older adults  may need to talk with a health care provider about what activities are safe to do. If you have a medical condition, be sure to talk with your health care provider before you start a new exercise program. What are some exercise ideas? Moderate-intensity exercise ideas include:  Walking 1 mile (1.6 km) in about  15 minutes.  Biking.  Hiking.  Golfing.  Dancing.  Water aerobics. Vigorous-intensity exercise ideas include:  Walking 4.5 miles (7.2 km) or more in about 1 hour.  Jogging or running 5 miles (8 km) in about 1 hour.  Biking 10 miles (16.1 km) or more in about 1 hour.  Lap swimming.  Roller-skating or in-line skating.  Cross-country skiing.  Vigorous competitive sports, such as football, basketball, and soccer.  Jumping rope.  Aerobic dancing.   What are some everyday activities that can help me to get exercise?  [place] work, such as: ? Pushing a Conservation officer, nature. ? Raking and bagging leaves.  Washing your car.  Pushing a stroller.  Shoveling snow.  Gardening.  Washing windows or floors. How can I be more active in my day-to-day activities?  Use stairs instead of an elevator.  Take a walk during your lunch break.  If you drive, park your car farther away from your work or school.  If you take public transportation, get off one stop early and walk the rest of the way.  Stand up or walk around during all of your indoor phone calls.  Get up, stretch, and walk around every 30 minutes throughout the day.  Enjoy exercise with a friend. Support to continue exercising will help you keep a regular routine of activity. What guidelines can I follow while exercising?  Before you start a new exercise program, talk with your health care provider.  Do not exercise so much that you hurt yourself, feel dizzy, or get very short of breath.  Wear comfortable clothes and wear shoes with good support.  Drink plenty of water while you exercise to prevent dehydration or heat stroke.  Work out until your breathing and your heartbeat get faster. Where to find more information  U.S. Department of Health and Human Services: BondedCompany.at  Centers for Disease Control and Prevention (CDC): http://www.wolf.info/ Summary  Exercising regularly is important. It will improve your overall  fitness, flexibility, and endurance.  Regular exercise also will improve your overall health. It can help you control your weight, reduce stress, and improve your bone density.  Do not exercise so much that you hurt yourself, feel dizzy, or get very short of breath.  Before you start a new exercise program, talk with your health care provider. This information is not intended to replace advice given to you by your health care provider. Make sure you discuss any questions you have with your health care provider. Document Revised: 02/22/2017 Document Reviewed: 01/31/2017 Elsevier Patient Education  2021 [place].   Budget-Friendly Healthy Eating There are many ways to save money at the grocery store and continue to eat healthy. You can be successful if you:  Plan meals according to your budget.  Make a grocery list and only purchase food according to your grocery list.  Prepare food yourself at home. What are tips for following this plan? Reading food labels  Compare food labels between brand name foods and the store brand. Often the nutritional value is the same, but the store brand is lower cost.  Look for products that do not have added sugar, fat, or salt (sodium). These  often cost the same but are healthier for you. Products may be labeled as: ? Sugar-free. ? Nonfat. ? Low-fat. ? Sodium-free. ? Low-sodium.  Look for lean ground beef labeled as at least 92% lean and 8% fat. Shopping  Buy only the items on your grocery list and go only to the areas of the store that have the items on your list.  Use coupons only for foods and brands you normally buy. Avoid buying items you wouldn't normally buy simply because they are on sale.  Check online and in newspapers for weekly deals.  Buy healthy items from the bulk bins when available, such as herbs, spices, flour, pasta, nuts, and dried fruit.  Buy fruits and vegetables that are in season. Prices are usually lower on  in-season produce.  Look at the unit price on the price tag. Use it to compare different brands and sizes to find out which item is the best deal.  Choose healthy items that are often low-cost, such as carrots, potatoes, apples, bananas, and oranges. Dried or canned beans are a low-cost protein source.  Buy in bulk and freeze extra food. Items you can buy in bulk include meats, fish, poultry, frozen fruits, and frozen vegetables.  Avoid buying "ready-to-eat" foods, such as pre-cut fruits and vegetables and pre-made salads.  If possible, shop around to discover where you can find the best prices. Consider other retailers such as dollar stores, larger [employer], local fruit and vegetable stands, and farmers markets.  Do not shop when you are hungry. If you shop while hungry, it may be hard to stick to your list and budget.  Resist impulse buying. Use your grocery list as your official plan for the week.  Buy a variety of vegetables and fruits by purchasing fresh, frozen, and canned items.  Look at the top and bottom shelves for deals. Foods at eye level (eye level of an adult or child) are usually more expensive.  Be efficient with your time when shopping. The more time you spend at the store, the more money you are likely to spend.  To save money when choosing more expensive foods like meats and dairy: ? Choose cheaper cuts of meat, such as bone-in chicken thighs and drumsticks instead of skinless and boneless chicken. When you are ready to prepare the chicken, you can remove the skin yourself to make it healthier. ? Choose lean meats like chicken or [place] instead of beef. ? Choose canned seafood, such as tuna, salmon, or sardines. ? Buy eggs as a low-cost source of protein. ? Buy dried beans and peas, such as lentils, split peas, or kidney beans instead of meats. Dried beans and peas are a good alternative source of protein. ? Buy the larger tubs of yogurt instead of  individual-sized containers.  Choose water instead of sodas and other sweetened beverages.  Avoid buying chips, cookies, and other "junk food." These items are usually expensive and not healthy.   Cooking  Make extra food and freeze the extras in meal-sized containers or in individual portions for fast meals and snacks.  Pre-cook on days when you have extra time to prepare meals in advance. You can keep these meals in the fridge or freezer and reheat for a quick meal.  When you come home from the grocery store, wash, peel, and cut fruits and vegetables so they are ready to use and eat. This will help reduce food waste. Meal planning  Do not eat out or get fast food.  Prepare food at home.  Make a grocery list and make sure to bring it with you to the store. If you have a smart phone, you could use your phone to create your shopping list.  Plan meals and snacks according to a grocery list and budget you create.  Use leftovers in your meal plan for the week.  Look for recipes where you can cook once and make enough food for two meals.  Prepare budget-friendly types of meals like stews, casseroles, and stir-fry dishes.  Try some meatless meals or try "no cook" meals like salads.  Make sure that half your plate is filled with fruits or vegetables. Choose from fresh, frozen, or canned fruits and vegetables. If eating canned, remember to rinse them before eating. This will remove any excess salt added for packaging. Summary  Eating healthy on a budget is possible if you plan your meals according to your budget, purchase according to your budget and grocery list, and prepare food yourself.  Tips for buying more food on a limited budget include buying generic brands, using coupons only for foods you normally buy, and buying healthy items from the bulk bins when available.  Tips for buying cheaper food to replace expensive food include choosing cheaper, lean cuts of meat, and buying dried  beans and peas. This information is not intended to replace advice given to you by your health care provider. Make sure you discuss any questions you have with your health care provider. Document Revised: 12/24/2019 Document Reviewed: 12/24/2019 Elsevier Patient Education  [place] protect organs, store calcium, anchor muscles, and support the whole body. Keeping your bones strong is important, especially as you get older. You can take actions to help keep your bones strong and healthy. Why is keeping my bones healthy important? Keeping your bones healthy is important because your body constantly replaces bone cells. Cells get old, and new cells take their place. As we age, we lose bone cells because the body may not be able to make enough new cells to replace the old cells. The amount of bone cells and bone tissue you have is referred to as bone mass. The higher your bone mass, the stronger your bones. The aging process leads to an overall loss of bone mass in the body, which can increase the likelihood of:  Joint pain and stiffness.  Broken bones.  A condition in which the bones become weak and brittle (osteoporosis). A large decline in bone mass occurs in older adults. In women, it occurs about the time of menopause.   What actions can I take to keep my bones healthy? Good health habits are important for maintaining healthy bones. This includes eating nutritious foods and exercising regularly. To have healthy bones, you need to get enough of the right minerals and vitamins. Most nutrition experts recommend getting these nutrients from the foods that you eat. In some cases, taking supplements may also be recommended. Doing certain types of exercise is also important for bone health. What are the nutritional recommendations for healthy bones? Eating a well-balanced diet with plenty of calcium and vitamin D will help to protect your bones. Nutritional  recommendations vary from person to person. Ask your health care provider what is healthy for you. Here are some general guidelines. Get enough calcium Calcium is the most important (essential) mineral for bone health. Most people can get enough calcium from their diet, but supplements may be recommended for people who are  at risk for osteoporosis. Good sources of calcium include:  Dairy products, such as low-fat or nonfat milk, cheese, and yogurt.  Dark green leafy vegetables, such as bok choy and broccoli.  Calcium-fortified foods, such as orange juice, cereal, bread, soy beverages, and tofu products.  Nuts, such as almonds. Follow these recommended amounts for daily calcium intake:  Children, age 64-3: 700 mg.  Children, age 52-8: 1,000 mg.  Children, age 31-13: 1,300 mg.  Teens, age 66-18: 1,300 mg.  Adults, age 79-50: 1,000 mg.  Adults, age 520-70: ? Men: 1,000 mg. ? Women: 1,200 mg.  Adults, age 524 or older: 1,200 mg.  Pregnant and breastfeeding females: ? Teens: 1,300 mg. ? Adults: 1,000 mg. Get enough vitamin D Vitamin D is the most essential vitamin for bone health. It helps the body absorb calcium. Sunlight stimulates the skin to make vitamin D, so be sure to get enough sunlight. If you live in a cold climate or you do not get outside often, your health care provider may recommend that you take vitamin D supplements. Good sources of vitamin D in your diet include:  Egg yolks.  Saltwater fish.  Milk and cereal fortified with vitamin D. Follow these recommended amounts for daily vitamin D intake:  Children and teens, age 64-18: 600 international units.  Adults, age 39 or younger: 400-800 international units.  Adults, age 83 or older: 800-1,000 international units. Get other important nutrients Other nutrients that are important for bone health include:  Phosphorus. This mineral is found in meat, poultry, dairy foods, nuts, and legumes. The recommended daily intake  for adult men and adult women is 700 mg.  Magnesium. This mineral is found in seeds, nuts, dark green vegetables, and legumes. The recommended daily intake for adult men is 400-420 mg. For adult women, it is 310-320 mg.  Vitamin K. This vitamin is found in green leafy vegetables. The recommended daily intake is 120 mg for adult men and 90 mg for adult women.   What type of physical activity is best for building and maintaining healthy bones? Weight-bearing and strength-building activities are important for building and maintaining healthy bones. Weight-bearing activities cause muscles and bones to work against gravity. Strength-building activities increase the strength of the muscles that support bones. Weight-bearing and muscle-building activities include:  Walking and hiking.  Jogging and running.  Dancing.  Gym exercises.  Lifting weights.  Tennis and racquetball.  Climbing stairs.  Aerobics. Adults should get at least 30 minutes of moderate physical activity on most days. Children should get at least 60 minutes of moderate physical activity on most days. Ask your health care provider what type of exercise is best for you.   How can I find out if my bone mass is low? Bone mass can be measured with an X-ray test called a bone mineral density (BMD) test. This test is recommended for all women who are age 23 or older. It may also be recommended for:  Men who are age 54 or older.  People who are at risk for osteoporosis because of: ? Having bones that break easily. ? Having a long-term disease that weakens bones, such as kidney disease or rheumatoid arthritis. ? Having menopause earlier than normal. ? Taking medicine that weakens bones, such as steroids, thyroid hormones, or hormone treatment for breast cancer or prostate cancer. ? Smoking. ? Drinking three or more alcoholic drinks a day. If you find that you have a low bone mass, you may be able to prevent  osteoporosis or further  bone loss by changing your diet and lifestyle. Where can I find more information? For more information, check out the following websites:  [place]: AviationTales.fr  [employer] of Health: www.bones.SouthExposed.es  International Osteoporosis Foundation: Administrator.iofbonehealth.org Summary  The aging process leads to an overall loss of bone mass in the body, which can increase the likelihood of broken bones and osteoporosis.  Eating a well-balanced diet with plenty of calcium and vitamin D will help to protect your bones.  Weight-bearing and strength-building activities are also important for building and maintaining strong bones.  Bone mass can be measured with an X-ray test called a bone mineral density (BMD) test. This information is not intended to replace advice given to you by your health care provider. Make sure you discuss any questions you have with your health care provider. Document Revised: 04/08/2017 Document Reviewed: 04/08/2017 Elsevier Patient Education  2021 [employer].

## 2020-04-09 LAB — NUSWAB BV AND CANDIDA, NAA
Candida albicans, NAA: NEGATIVE
Candida glabrata, NAA: NEGATIVE

## 2020-07-11 ENCOUNTER — Ambulatory Visit
Admission: RE | Admit: 2020-07-11 | Discharge: 2020-07-11 | Disposition: A | Discharge status: Home / Self Care | Payer: 59 | Source: Ambulatory Visit | Attending: Obstetrics and Gynecology | Admitting: Obstetrics and Gynecology

## 2020-07-11 ENCOUNTER — Other Ambulatory Visit: Payer: Self-pay

## 2020-07-11 DIAGNOSIS — Z1231 Encounter for screening mammogram for malignant neoplasm of breast: Secondary | ICD-10-CM | POA: Diagnosis not present

## 2020-07-14 NOTE — Progress Notes (Signed)
  Postoperative Follow-up Patient presents post op from  [facility] Robot-assisted Laparoscopic Hysterectomy and Bilateral Salpingectomy  for  Menorrhagia and uterine fibroids , 3 months ago.  Subjective: Patient reports marked improvement in her preop symptoms. Eating a regular diet without difficulty. The patient is not having any pain.  Activity: normal activities of daily living. Patient reports additional symptom's since surgery of None.  Objective: BP (!) 152/98   Ht 5\' 3"  (1.6 m)   Wt 186 lb (84.4 kg)   LMP 03/20/2019 (Exact Date)   BMI 32.95 kg/m  Physical Exam Constitutional:      Appearance: Normal appearance. She is well-developed.  Genitourinary:     Genitourinary Comments: Intact vaginal cuff on speculum exam  HENT:     Head: Normocephalic and atraumatic.  Eyes:     Extraocular Movements: Extraocular movements intact.     Pupils: Pupils are equal, round, and reactive to light.  Neck:     Thyroid: No thyromegaly.  Cardiovascular:     Rate and Rhythm: Normal rate and regular rhythm.     Heart sounds: Normal heart sounds.  Pulmonary:     Effort: Pulmonary effort is normal.     Breath sounds: Normal breath sounds.  Abdominal:     General: Bowel sounds are normal. There is no distension.     Palpations: Abdomen is soft. There is no mass.  Musculoskeletal:     Cervical back: Neck supple.  Neurological:     Mental Status: She is alert and oriented to person, place, and time.  Skin:    General: Skin is warm and dry.  Psychiatric:        Behavior: Behavior normal.        Thought Content: Thought content normal.        Judgment: Judgment normal.  Vitals reviewed. Exam conducted with a chaperone present.     Assessment: s/p :  Patient presents post op from  [facility] Robot-assisted Laparoscopic Hysterectomy and Bilateral Salpingectomy  stable  Plan: Patient has done well after surgery with no apparent complications.  I have discussed the post-operative course to date, and the  expected progress moving forward.  The patient understands what complications to be concerned about.  I will see the patient in routine follow up, or sooner if needed.    Activity plan: No restriction.  Pelvic rest.  Adrian Prows MD, [place] 07/14/2020 9:09 PM

## 2020-08-10 ENCOUNTER — Other Ambulatory Visit: Payer: Self-pay | Admitting: Obstetrics and Gynecology

## 2020-08-10 DIAGNOSIS — I10 Essential (primary) hypertension: Secondary | ICD-10-CM

## 2021-03-23 ENCOUNTER — Encounter: Payer: Self-pay | Admitting: Oncology

## 2021-08-29 ENCOUNTER — Other Ambulatory Visit: Payer: Self-pay | Admitting: Cardiology

## 2021-08-29 ENCOUNTER — Other Ambulatory Visit: Payer: Self-pay | Admitting: Internal Medicine

## 2021-08-29 DIAGNOSIS — Z1231 Encounter for screening mammogram for malignant neoplasm of breast: Secondary | ICD-10-CM

## 2021-09-22 ENCOUNTER — Ambulatory Visit
Admission: RE | Admit: 2021-09-22 | Discharge: 2021-09-22 | Disposition: A | Discharge status: Home / Self Care | Payer: 59 | Source: Ambulatory Visit | Attending: Internal Medicine | Admitting: Internal Medicine

## 2021-09-22 ENCOUNTER — Encounter: Payer: Self-pay | Admitting: Oncology

## 2021-09-22 DIAGNOSIS — Z1231 Encounter for screening mammogram for malignant neoplasm of breast: Secondary | ICD-10-CM | POA: Diagnosis present

## 2022-01-12 IMAGING — MG DIGITAL SCREENING BILAT W/ TOMO W/ CAD
8 series · 8 of 24 positions shown · non-contrast
Comparison: Previous exam(s).

CLINICAL DATA: Screening.

EXAM:
DIGITAL SCREENING BILATERAL MAMMOGRAM WITH TOMO AND CAD

[L MLO synth-2D]
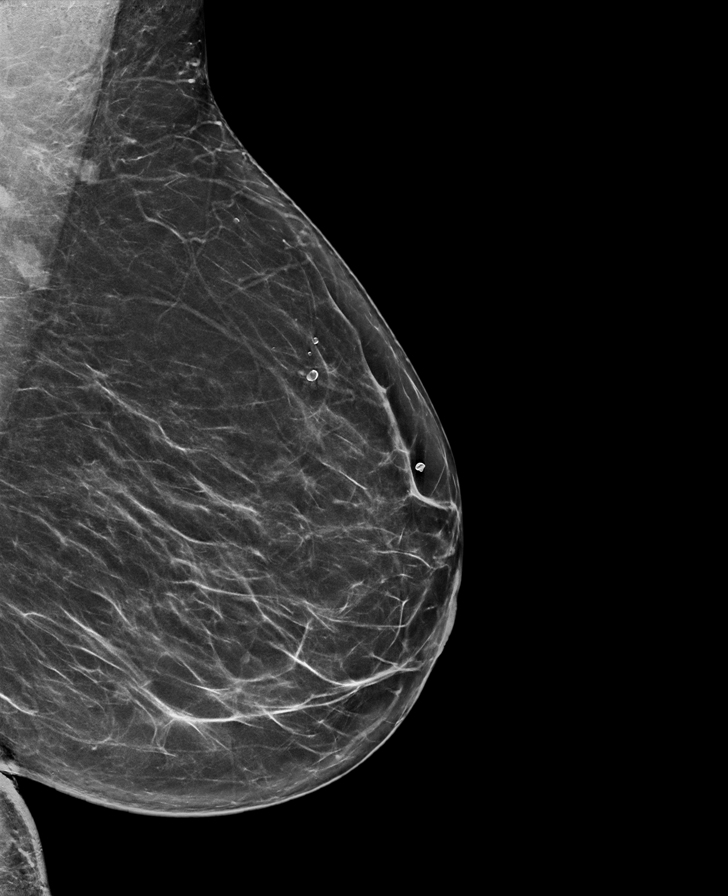

[L CC synth-2D]
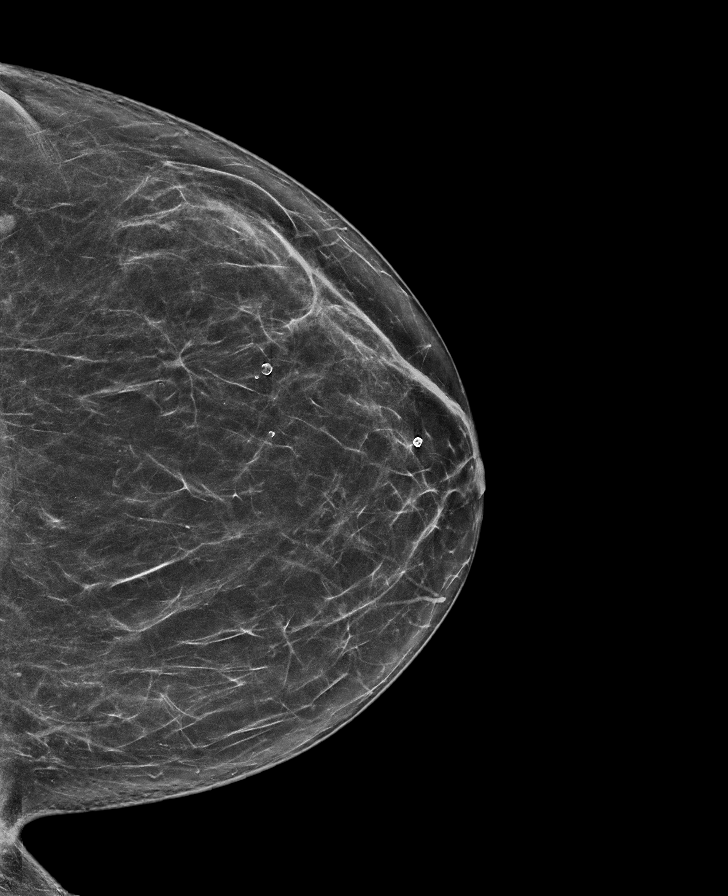

[R CC synth-2D]
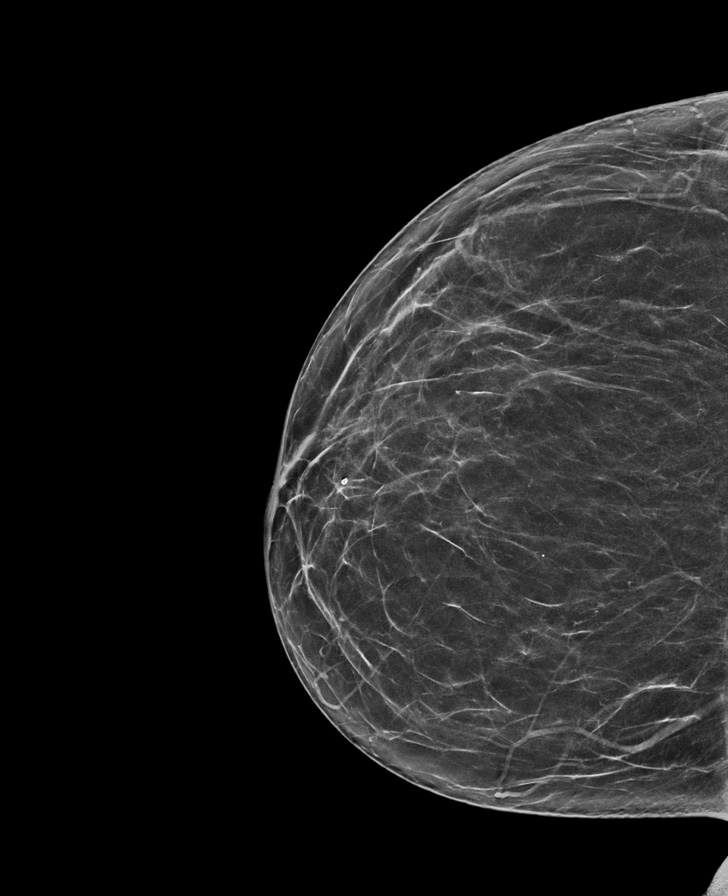

[R MLO synth-2D]
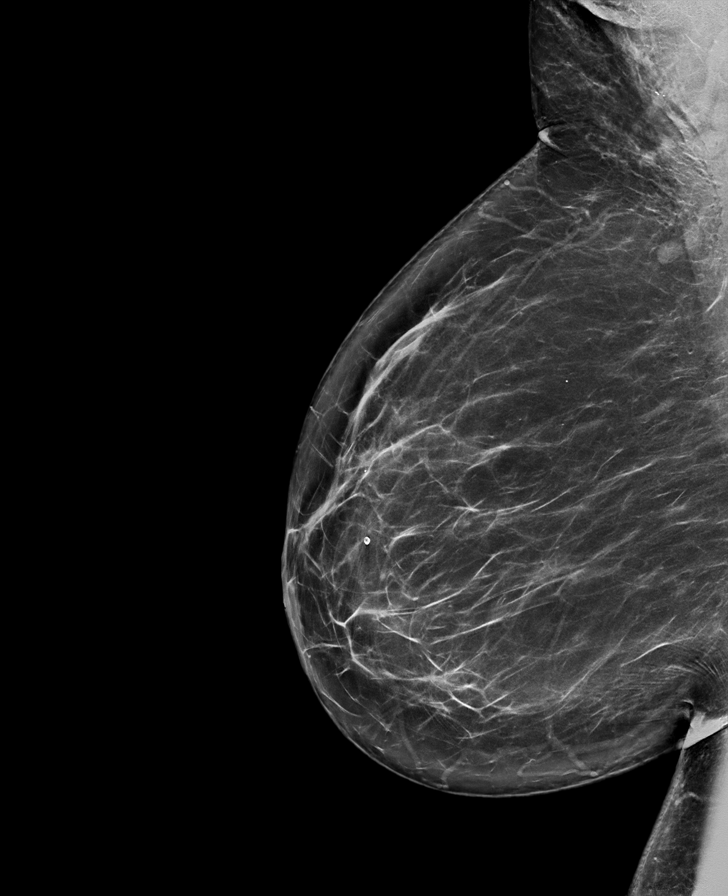

[L CC tomo · tomo slice 39/76.0]
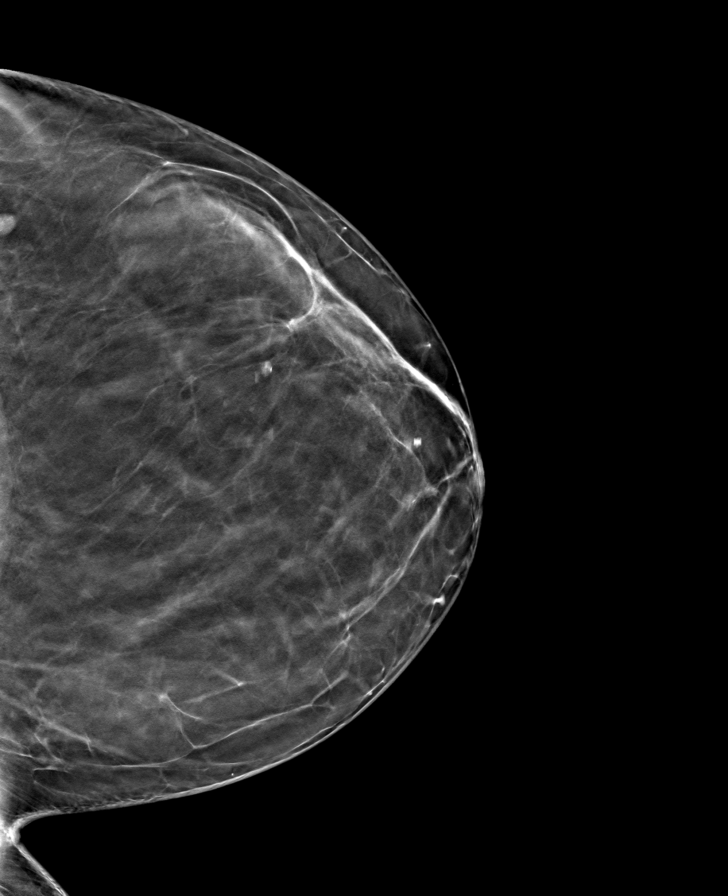

[R MLO tomo · tomo slice 43/86.0]
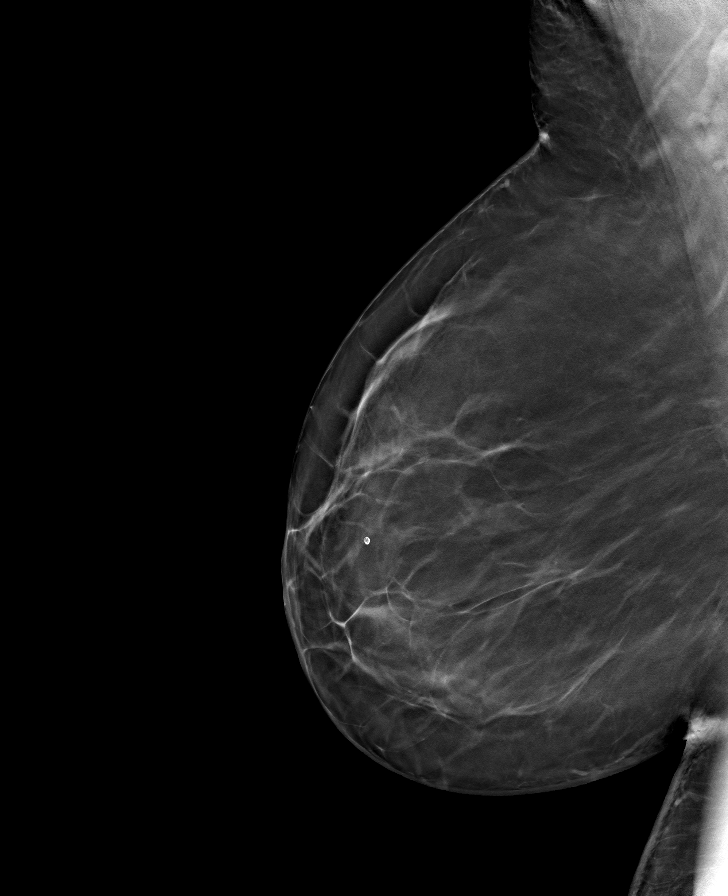

[L MLO tomo · tomo slice 39/78.0]
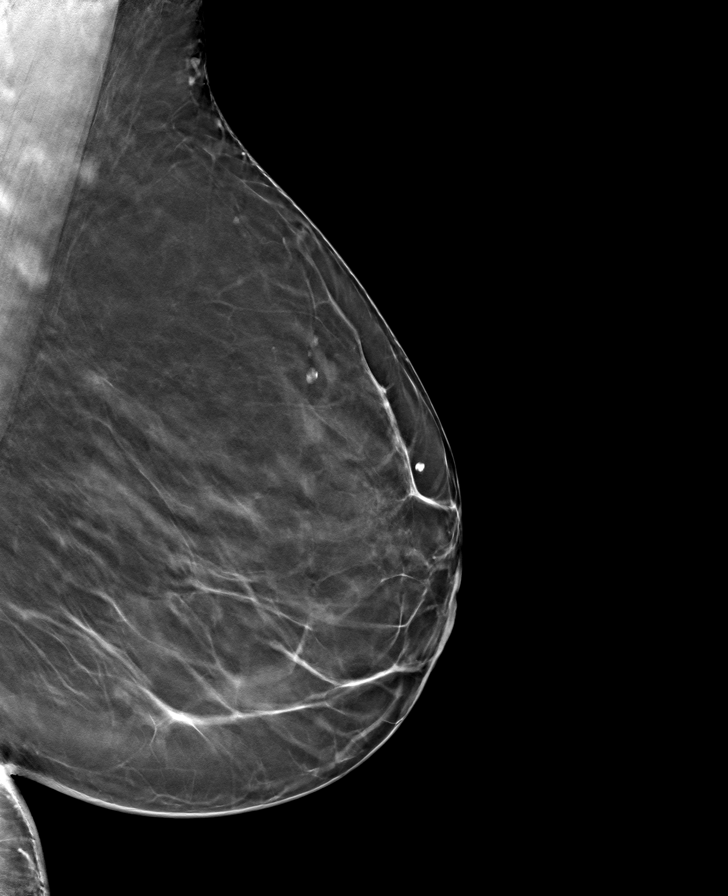

[R CC tomo · tomo slice 34/67.0]
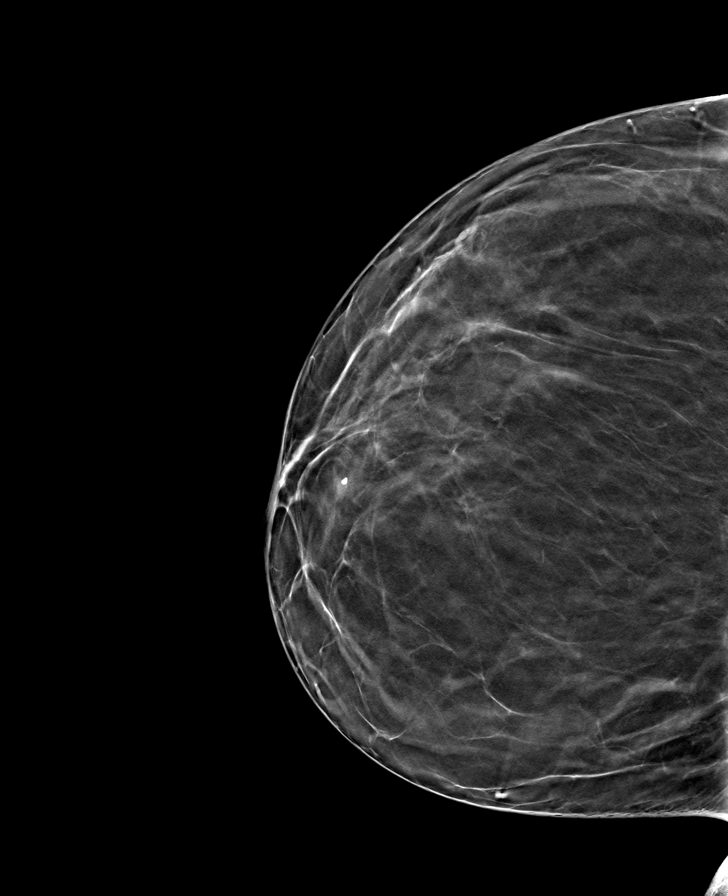

[8 of 24 positions shown; findings below may reference images not displayed]

ACR Breast Density Category b: There are scattered areas of
fibroglandular density.
FINDINGS: There are no findings suspicious for malignancy. Images were
processed with CAD.
IMPRESSION: No mammographic evidence of malignancy. A result letter of this
screening mammogram will be mailed directly to the patient.

RECOMMENDATION:
Screening mammogram in one year. (Code:[id number])

BI-RADS CATEGORY  1: Negative.

## 2022-01-22 ENCOUNTER — Encounter: Payer: Self-pay | Admitting: Oncology

## 2022-01-23 ENCOUNTER — Encounter: Payer: Self-pay | Admitting: Oncology
